# Patient Record
Sex: Female | Born: 1990 | Race: Black or African American | Hispanic: No | Marital: Single | Smoking: Never smoker
Health system: Southern US, Community
[De-identification: ages and names within clinical notes are randomized; demographics above are authoritative.]

## PROBLEM LIST (undated history)

## (undated) DIAGNOSIS — R87619 Unspecified abnormal cytological findings in specimens from cervix uteri: Secondary | ICD-10-CM

## (undated) HISTORY — PX: NO PAST SURGERIES: SHX2092

## (undated) HISTORY — DX: Unspecified abnormal cytological findings in specimens from cervix uteri: R87.619

---

## 2014-04-27 ENCOUNTER — Emergency Department: Payer: Self-pay | Admitting: Emergency Medicine

## 2016-02-28 HISTORY — PX: COLPOSCOPY: SHX161

## 2016-06-12 ENCOUNTER — Encounter (INDEPENDENT_AMBULATORY_CARE_PROVIDER_SITE_OTHER): Payer: Self-pay

## 2016-06-12 ENCOUNTER — Ambulatory Visit: Payer: Self-pay | Attending: Oncology

## 2016-06-12 DIAGNOSIS — R87622 Low grade squamous intraepithelial lesion on cytologic smear of vagina (LGSIL): Secondary | ICD-10-CM

## 2016-06-12 NOTE — Progress Notes (Signed)
26 year old patient referred to [facility] by [facility] Dept for abnormal pap with LSIL results, not tested for HPV.  Referred to Dr. Greggory Keen on 06/20/16 for consult, and possible biopsy.  Reviewed pap results with patient.  Literature on HPV given to patient.

## 2016-06-20 ENCOUNTER — Other Ambulatory Visit: Payer: Self-pay | Admitting: Obstetrics and Gynecology

## 2016-06-20 ENCOUNTER — Ambulatory Visit (INDEPENDENT_AMBULATORY_CARE_PROVIDER_SITE_OTHER): Payer: PRIVATE HEALTH INSURANCE | Admitting: Obstetrics and Gynecology

## 2016-06-20 ENCOUNTER — Encounter: Payer: Self-pay | Admitting: Obstetrics and Gynecology

## 2016-06-20 VITALS — BP 105/74 | HR 83 | Wt 321.9 lb

## 2016-06-20 DIAGNOSIS — Z8619 Personal history of other infectious and parasitic diseases: Secondary | ICD-10-CM | POA: Diagnosis not present

## 2016-06-20 DIAGNOSIS — A5901 Trichomonal vulvovaginitis: Secondary | ICD-10-CM | POA: Diagnosis not present

## 2016-06-20 DIAGNOSIS — R87612 Low grade squamous intraepithelial lesion on cytologic smear of cervix (LGSIL): Secondary | ICD-10-CM

## 2016-06-20 LAB — POCT URINE PREGNANCY: Preg Test, Ur: NEGATIVE

## 2016-06-20 MED ORDER — METRONIDAZOLE 500 MG PO TABS: 500.0000 mg | ORAL_TABLET | Freq: Two times a day (BID) | ORAL | 0 refills | Status: DC

## 2016-06-20 NOTE — Patient Instructions (Addendum)
1. Colposcopy with biopsies is performed today 2. Return in 2 weeks for follow-up on pathology reports 3. Strongly encourage condom use 4. Strongly encouraged avoidance of tobacco products 5. Take metronidazole 500 mg twice a day for 7 days   Colposcopy, Care After This sheet gives you information about how to care for yourself after your procedure. Your doctor may also give you more specific instructions. If you have problems or questions, contact your doctor. What can I expect after the procedure? If you did not have a tissue sample removed (did not have a biopsy), you may only have some spotting for a few days. You can go back to your normal activities. If you had a tissue sample removed, it is common to have:  Soreness and pain. This may last for a few days.  Light-headedness.  Mild bleeding from your vagina or dark-colored, grainy discharge from your vagina. This may last for a few days. You may need to wear a sanitary pad.  Spotting for at least 48 hours after the procedure. Follow these instructions at home:  Take over-the-counter and prescription medicines only as told by your doctor. Ask your doctor what medicines you can start taking again. This is very important if you take blood-thinning medicine.  Do not drive or use heavy machinery while taking prescription pain medicine.  For 3 days, or as long as your doctor tells you, avoid:  Douching.  Using tampons.  Having sex.  If you use birth control (contraception), keep using it.  Limit activity for the first day after the procedure. Ask your doctor what activities are safe for you.  It is up to you to get the results of your procedure. Ask your doctor when your results will be ready.  Keep all follow-up visits as told by your doctor. This is important. Contact a doctor if:  You get a skin rash. Get help right away if:  You are bleeding a lot from your vagina. It is a lot of bleeding if you are using more than  one pad an hour for 2 hours in a row.  You have clumps of blood (blood clots) coming from your vagina.  You have a fever.  You have chills  You have pain in your lower belly (pelvic area).  You have signs of infection, such as vaginal discharge that is:  Different than usual.  Yellow.  Bad-smelling.  You have very pain or cramps in your lower belly that do not get better with medicine.  You feel light-headed.  You feel dizzy.  You pass out (faint). Summary  If you did not have a tissue sample removed (did not have a biopsy), you may only have some spotting for a few days. You can go back to your normal activities.  If you had a tissue sample removed, it is common to have mild pain and spotting for 48 hours.  For 3 days, or as long as your doctor tells you, avoid douching, using tampons and having sex.  Get help right away if you have bleeding, very bad pain, or signs of infection. This information is not intended to replace advice given to you by your health care provider. Make sure you discuss any questions you have with your health care provider. Document Released: 08/02/2007 Document Revised: 11/03/2015 Document Reviewed: 11/03/2015 Elsevier Interactive Patient Education  2017 [employer].

## 2016-06-20 NOTE — Progress Notes (Addendum)
GYN ENCOUNTER NOTE  Subjective:       Michele Scott is a 26 y.o. G0P0000 female is here for gynecologic evaluation of the following issues:  1. BCCCPReferral 2. LGSIL Pap smear  Nonsmoker. History of 2 partners in the past 12 months History of chlamydia infection 8 months ago, treated Condom use, inconsistent First abnormal Pap smear-LGSIL, referred from [facility]   Gynecologic History Patient's last menstrual period was 04/20/2016 (approximate). Contraception: condoms Last Pap: LGSIL Last mammogram: N/A  Obstetric History OB History  Gravida Para Term Preterm AB Living  0 0 0 0 0 0  SAB TAB Ectopic Multiple Live Births  0 0 0 0 0        Past Medical History:  Diagnosis Date  . Abnormal Pap smear of cervix     Past Surgical History:  Procedure Laterality Date  . NO PAST SURGERIES      No current outpatient prescriptions on file prior to visit.   No current facility-administered medications on file prior to visit.     No Known Allergies  Social History   Social History  . Marital status: Single    Spouse name: N/A  . Number of children: N/A  . Years of education: N/A   Occupational History  . Not on file.   Social History Main Topics  . Smoking status: Never Smoker  . Smokeless tobacco: Never Used  . Alcohol use Yes     Comment: occas  . Drug use: No  . Sexual activity: Yes    Birth control/ protection: None   Other Topics Concern  . Not on file   Social History Narrative  . No narrative on file    Family History  Problem Relation Age of Onset  . Diabetes Mother   . Breast cancer Neg Hx   . Ovarian cancer Neg Hx   . Colon cancer Neg Hx   . Heart disease Neg Hx     The following portions of the patient's history were reviewed and updated as appropriate: allergies, current medications, past family history, past medical history, past social history, past surgical history and problem list.  Review of Systems Review of Systems  - Objective:   BP 105/74   Pulse 83   Wt (!) 321 lb 14.4 oz (146 kg)   LMP 04/20/2016 (Approximate)  CONSTITUTIONAL: Well-developed, well-nourished female in no acute distress.  HENT:  Normocephalic, atraumatic.  NECK: Not examined SKIN: Skin is warm and dry. No rash noted. Not diaphoretic. No erythema. No pallor. NEUROLGIC: Alert and oriented to person, place, and time. PSYCHIATRIC: Normal mood and affect. Normal behavior. Normal judgment and thought content. CARDIOVASCULAR:Not Examined RESPIRATORY: Not Examined BREASTS: Not Examined ABDOMEN: Soft, non distended; Non tender.  No Organomegaly. PELVIC:  External Genitalia: Normal  BUS: Normal  Vagina: Frothy white discharge  Cervix: Normal  Uterus: Not examined  Adnexa: Not examined  RV: Normal external exam  Bladder: Nontender MUSCULOSKELETAL: Normal range of motion. No tenderness.  No cyanosis, clubbing, or edema.  PROCEDURE: Colposcopy of cervix and upper adjacent vagina with biopsies Indications: LGSIL Pap smear Findings: SCJ is visualized; prominent rim of acetowhite epithelium 360 Biopsies: Cervix biopsy 5:00, cervix biopsy 7:00, cervix biopsy 11:00 Description: Verbal consent is obtained for colposcopy. The patient was placed in the dorsal lithotomy position. A Graves' speculum was placed into the vagina to facilitate visualization of the cervix. Cervix is painted with acetic acid solution using Fox swabs. Colposcopy is performed with normal lites and green filter.  The above-noted findings were identified. Cervical biopsies were taken. Monsel solution was applied for hemostasis. Procedure was well-tolerated. Blood loss is moderate requiring Monsel solution for hemostasis. Post procedure instructions sheet is given.  PROCEDURE: Wet prep Normal saline-moderate 1 blood cells; Trichomonas present; no clue cells KOH-negative  Assessment:   1. 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 Morbid obesity  (HCC)  3. History of chlamydia  4. Trichomonas vaginitis     Plan:   1. Colposcopy of cervix and upper adjacent vagina with biopsies 2. Cervical biopsies: 3. Return in 2 weeks for follow-up 4. Encourage condom use 5. Encourage avoidance of tobacco products 6. Metronidazole 500 mg twice a day for 7 days; recommend contact be treated before resuming intercourse  Herold Harms, MD  Note: This dictation was prepared with Dragon dictation along with smaller phrase technology. Any transcriptional errors that result from this process are unintentional.

## 2016-06-23 LAB — PATHOLOGY

## 2016-08-14 NOTE — Progress Notes (Addendum)
Per Dr Greggory KeeneFrancesco, benign cervical biopsy results.  Patient to return to [facility] 01/03/17 for 6 month follow-up colposcopy.  Copy to [facility].

## 2017-01-03 ENCOUNTER — Encounter: Payer: PRIVATE HEALTH INSURANCE | Admitting: Obstetrics and Gynecology

## 2017-03-06 ENCOUNTER — Encounter: Payer: PRIVATE HEALTH INSURANCE | Admitting: Obstetrics and Gynecology

## 2018-07-11 ENCOUNTER — Encounter: Payer: Self-pay | Admitting: Obstetrics and Gynecology

## 2018-07-11 ENCOUNTER — Other Ambulatory Visit (HOSPITAL_COMMUNITY)
Admission: RE | Admit: 2018-07-11 | Discharge: 2018-07-11 | Disposition: A | Discharge status: Home / Self Care | Payer: Self-pay | Source: Ambulatory Visit | Attending: Obstetrics and Gynecology | Admitting: Obstetrics and Gynecology

## 2018-07-11 ENCOUNTER — Ambulatory Visit (INDEPENDENT_AMBULATORY_CARE_PROVIDER_SITE_OTHER): Payer: BLUE CROSS/BLUE SHIELD | Admitting: Obstetrics and Gynecology

## 2018-07-11 ENCOUNTER — Other Ambulatory Visit: Payer: Self-pay

## 2018-07-11 VITALS — BP 110/80 | HR 101 | Ht 68.0 in | Wt 312.0 lb

## 2018-07-11 DIAGNOSIS — Z124 Encounter for screening for malignant neoplasm of cervix: Secondary | ICD-10-CM | POA: Insufficient documentation

## 2018-07-11 DIAGNOSIS — Z113 Encounter for screening for infections with a predominantly sexual mode of transmission: Secondary | ICD-10-CM | POA: Insufficient documentation

## 2018-07-11 DIAGNOSIS — N939 Abnormal uterine and vaginal bleeding, unspecified: Secondary | ICD-10-CM

## 2018-07-11 NOTE — Progress Notes (Signed)
Gynecology H&P  PCP: Michele Scott, No Pcp Per  Chief Complaint:  Chief Complaint  Michele Scott presents with   Gynecologic Exam    STD testing, Hx of abnormal pap    History of Present Illness: Michele Scott is a 28 y.o. G0P0000 presenting for evaluation of infertility. Michele Scott has not been actively contracepting for several years but has not conceived.   Michele Scott is a 28 y.o. G0P0000 presents for STD testing.  The Michele Scott has not noted intermenstrual spotting,  has not experienced postcoital bleeding, and does not report increased vaginal discharge.  There is a history of prior sexually transmitted infection(s).     The Michele Scott is sexually active. She currently uses None for contraception.  The Michele Scott has not been previously transfused or tattooed.     Ovulatory Evaluation LMP: Michele Scott's last menstrual period was 06/18/2018 (exact date). Interval irregular Duration of flow: variable but prolonged 2-3 weeks Heavy Menses: no Clots: no Intermenstrual Bleeding: no Dysmenorrhea: no  Molimina sometime Ovulation Predictor Kits Positive  no  PCOS  Wt Change: yes up and down 25lbs in past year Hirsutism: yes Acne: yes  Hyperprolactinemia Galactorrhea: no Headaches: no Vision Changes:  no  Thyroid Temperature Intolerance: yes Constipation or Diarrhea: yes Hair Thinning:  yes Palpitation:  no  Prior treatments Meds: none Other Therapies: Not applicable  Premature Ovarian Failure Family history of autism, mental retardation, or fragile X: no Family history of premature ovarian failure or early menopause: no Prior radiation or chemotherapy exposoure:  no  Tubal Factor Previous abdominal or pelvic surgery: no Pelvic Pain:  no Endometriosis: no STD: yes remote history of chlamydia PID: no Laparoscopy: no Prior HSG: no  Review of Systems: 10 point review of systems negative unless otherwise noted in HPI  Past Medical History:  Past Medical History:  Diagnosis Date    Abnormal Pap smear of cervix     Past Surgical History:  Past Surgical History:  Procedure Laterality Date   COLPOSCOPY  2018   [facility]   NO PAST SURGERIES      Obstetric History:  Never pregnant  Family History:  Family History  Problem Relation Age of Onset   Diabetes Mother    Breast cancer Neg Hx    Ovarian cancer Neg Hx    Colon cancer Neg Hx    Heart disease Neg Hx    Thyroid Problems: no Heart Condition or High Blood Pressure: no Blood Clot or Stroke: no Diabetes: no Cancer: no Birth Defects/Inherited diseases:no Infectious diseases (mumps, TB, Rubella):no Other Medical Problems: no  Social History:  Social History   Socioeconomic History   Marital status: Single    Spouse name: Not on file   Number of children: Not on file   Years of education: Not on file   Highest education level: Not on file  Occupational History   Not on file  Social Needs   Financial resource strain: Not on file   Food insecurity:    Worry: Not on file    Inability: Not on file   Transportation needs:    Medical: Not on file    Non-medical: Not on file  Tobacco Use   Smoking status: Never Smoker   Smokeless tobacco: Never Used  Substance and Sexual Activity   Alcohol use: Yes    Comment: occas   Drug use: Yes    Types: Marijuana   Sexual activity: Yes    Birth control/protection: None  Lifestyle   Physical activity:    Days per  week: Not on file    Minutes per session: Not on file   Stress: Not on file  Relationships   Social connections:    Talks on phone: Not on file    Gets together: Not on file    Attends religious service: Not on file    Active member of club or organization: Not on file    Attends meetings of clubs or organizations: Not on file    Relationship status: Not on file   Intimate partner violence:    Fear of current or ex partner: Not on file    Emotionally abused: Not on file    Physically abused: Not on file     Forced sexual activity: Not on file  Other Topics Concern   Not on file  Social History Narrative   Not on file    Allergies:  No Known Allergies  Medications: Prior to Admission medications   Not on File    Physical Exam Vitals: Blood pressure 110/80, pulse (!) 101, height  (1.727 m), weight (!) 312 lb (141.5 kg), last menstrual period 06/18/2018.  General: NAD, well nourished, appears stated age HEENT: normocephalic, anicteric Pulmonary: No increased work of breathing Genitourinary:  External: Normal external female genitalia.  Normal urethral meatus, normal Bartholin's and Skene's glands.    Vagina: Normal vaginal mucosa, no evidence of prolapse.    Cervix: Grossly normal in appearance, no bleeding  Uterus: Non-enlarged, mobile, normal contour.  No CMT  Adnexa: ovaries non-enlarged, no adnexal masses  Rectal: deferred  Lymphatic: no evidence of inguinal lymphadenopathy Extremities: no edema, erythema, or tenderness Neurologic: Grossly intact Psychiatric: mood appropriate, affect full  @  Assessment: 28 y.o. G0P0000 presenting for initial infertility evaluatoin Plan: 1) We discussed the underlying etiologies which may be implicated in a couple experiencing difficulty conceiving.  The average couple will conceive within the span of 1 year with unprotected coitus, with a monthly fecundity rate of 20% or 1 in 5.  Even without further work up or intervention the Michele Scott and her partner may be successful in conceiving unassisted, although if an underlying etiology can be identified and addressed fecundity rate may improve.  The work up entails examining for ovulatory function, tubal patency, and ruling out female factor infertility.  These may be looked at concurrently or sequentially.  The downside of sequential work up is that this method may miss issues if more than one compartment is contributing.  She is aware that tubal factor or moderate to severe female factor  infertility will require further consultation with a reproductive endocrinologist.  In the case of anovulation, use of Clomid (clomiphen citrate) or Femara (letrazole) were discussed with the understanding the the later is an off-label, but well supported use.  With either of these drugs the risk of multiples increases from the standard population rate of 2% to approximately 10%, with higher order multiples possible but unlikely.  Both drugs may require some time to titrate to the appropriate dosage to ensure consistent ovulation.  Cycles will be limited to 6 cycles on each drug secondary to decreasing rates of conception after 6 cycles.  In addition should Michele Scott be started on ovulation induction with Clomid she was advised to discontinue the drug for any vision changes as this is a rare but potentially permanent side-effect if medication is continued.  We discussed timing of intercourse as well as the use of ovulation predictor kits identify the Michele Scott's fertile window each month.     2) STI  screening obtained as well as pap given history of prior LGSIL pap  3) Return in about 1 year (around 07/11/2019) for annual.    Vena AustriaAndreas Aariz Maish, MD, Merlinda FrederickFACOG [facility], [facility] 07/11/2018, 7:31 PM     Menses more remote history of chlamydia always been kind of irregular weight up, some problems hair growth acne  Discussed letrozole  Pap and STI testing

## 2018-07-12 ENCOUNTER — Telehealth: Payer: Self-pay | Admitting: Family

## 2018-07-12 DIAGNOSIS — R059 Cough, unspecified: Secondary | ICD-10-CM

## 2018-07-12 DIAGNOSIS — R05 Cough: Secondary | ICD-10-CM

## 2018-07-12 MED ORDER — BENZONATATE 100 MG PO CAPS: 100.0000 mg | ORAL_CAPSULE | Freq: Three times a day (TID) | ORAL | 0 refills | Status: DC | PRN

## 2018-07-12 MED ORDER — ALBUTEROL SULFATE HFA 108 (90 BASE) MCG/ACT IN AERS: 2.0000 | INHALATION_SPRAY | Freq: Four times a day (QID) | RESPIRATORY_TRACT | 0 refills | Status: DC | PRN

## 2018-07-12 NOTE — Progress Notes (Signed)
Greater than 5 minutes, yet less than 10 minutes of time have been spent researching, coordinating, and implementing care for this patient today.  Thank you for the details you included in the comment boxes. Those details are very helpful in determining the best course of treatment for you and help us to provide the best care.  E-Visit for Corona Virus Screening  Based on your current symptoms, it seems unlikely that your symptoms are related to the Coronavirus.   You have been enrolled in MyChart Home Monitoring for COVID-19. Daily you will receive a questionnaire within the MyChart website. Our COVID-19 response team will be monitoring your responses daily.   Coronavirus disease 2019 (COVID-19) is a respiratory illness that can spread from person to person. The virus that causes COVID-19 is a new virus that was first identified in the country of [place] but is now found in multiple other countries and has spread to the [place].  Symptoms associated with the virus are mild to severe fever, cough, and shortness of breath. There is currently no vaccine to protect against COVID-19, and there is no specific antiviral treatment for the virus.   To be considered HIGH RISK for Coronavirus (COVID-19), you have to meet the following criteria:  . Traveled to [place], [place], [place], [place] or [place]; or in the [place] to [place], [place], [place], or [place]; and have fever, cough, and shortness of breath within the last 2 weeks of travel OR  . Been in close contact with a person diagnosed with COVID-19 within the last 2 weeks and have fever, cough, and shortness of breath  . IF YOU DO NOT MEET THESE CRITERIA, YOU ARE CONSIDERED LOW RISK FOR COVID-19.   It is vitally important that if you feel that you have an infection such as this virus or any other virus that you stay home and away from places where you may spread it to others.  You should self-quarantine for 14 days if you have  symptoms that could potentially be coronavirus and avoid contact with people age 65 and older.   You can use medication such as A prescription cough medication called Tessalon Perles 100 mg. You may take 1-2 capsules every 8 hours as needed for cough and A prescription inhaler called Albuterol MDI 90 mcg /actuation 2 puffs every 4 hours as needed for shortness of breath, wheezing, cough  You may also take acetaminophen (Tylenol) as needed for fever.   Reduce your risk of any infection by using the same precautions used for avoiding the common cold or flu:  . Wash your hands often with soap and warm water for at least 20 seconds.  If soap and water are not readily available, use an alcohol-based hand sanitizer with at least 60% alcohol.  . If coughing or sneezing, cover your mouth and nose by coughing or sneezing into the elbow areas of your shirt or coat, into a tissue or into your sleeve (not your hands). . Avoid shaking hands with others and consider head nods or verbal greetings only. . Avoid touching your eyes, nose, or mouth with unwashed hands.  . Avoid close contact with people who are sick. . Avoid places or events with large numbers of people in one location, like concerts or sporting events. . Carefully consider travel plans you have or are making. . If you are planning any travel outside or inside the [place], visit the CDC's Travelers' Health webpage for the latest health notices. .   If you have some symptoms but not all symptoms, continue to monitor at home and seek medical attention if your symptoms worsen. . If you are having a medical emergency, call 911.  HOME CARE . Only take medications as instructed by your medical team. . Drink plenty of fluids and get plenty of rest. . A steam or ultrasonic humidifier can help if you have congestion.   GET HELP RIGHT AWAY IF: . You develop worsening fever. . You become short of breath . You cough up blood. . Your symptoms become more  severe MAKE SURE YOU   Understand these instructions.  Will watch your condition.  Will get help right away if you are not doing well or get worse.  Your e-visit answers were reviewed by a board certified advanced clinical practitioner to complete your personal care plan.  Depending on the condition, your plan could have included both over the counter or prescription medications.  If there is a problem please reply once you have received a response from your provider. Your safety is important to us.  If you have drug allergies check your prescription carefully.    You can use MyChart to ask questions about today's visit, request a non-urgent call back, or ask for a work or school excuse for 24 hours related to this e-Visit. If it has been greater than 24 hours you will need to follow up with your provider, or enter a new e-Visit to address those concerns. You will get an e-mail in the next two days asking about your experience.  I hope that your e-visit has been valuable and will speed your recovery. Thank you for using e-visits.    

## 2018-07-13 LAB — TSH+PRL+FSH+TESTT+LH+DHEA S...
17-Hydroxyprogesterone: 136 ng/dL
Androstenedione: 66 ng/dL (ref 41–262)
DHEA-SO4: 258.6 ug/dL (ref 84.8–378.0)
FSH: 2.7 m[IU]/mL
LH: 9 m[IU]/mL
Prolactin: 16.5 ng/mL (ref 4.8–23.3)
TSH: 1.81 u[IU]/mL (ref 0.450–4.500)
Testosterone, Free: 1.5 pg/mL (ref 0.0–4.2)
Testosterone: 22 ng/dL (ref 8–48)

## 2018-07-13 LAB — HEP, RPR, HIV PANEL
HIV Screen 4th Generation wRfx: NONREACTIVE
Hepatitis B Surface Ag: NEGATIVE
RPR Ser Ql: NONREACTIVE

## 2018-07-13 LAB — HEPATITIS C ANTIBODY: Hep C Virus Ab: 4.4 s/co ratio — ABNORMAL HIGH (ref 0.0–0.9)

## 2018-07-16 ENCOUNTER — Other Ambulatory Visit: Payer: Self-pay | Admitting: Obstetrics and Gynecology

## 2018-07-16 DIAGNOSIS — N939 Abnormal uterine and vaginal bleeding, unspecified: Secondary | ICD-10-CM

## 2018-07-16 DIAGNOSIS — E282 Polycystic ovarian syndrome: Secondary | ICD-10-CM

## 2018-07-16 DIAGNOSIS — R768 Other specified abnormal immunological findings in serum: Secondary | ICD-10-CM

## 2018-07-17 LAB — CYTOLOGY - PAP
Chlamydia: POSITIVE — AB
Diagnosis: UNDETERMINED — AB
HPV: NOT DETECTED
Neisseria Gonorrhea: NEGATIVE

## 2018-07-19 ENCOUNTER — Telehealth: Payer: Self-pay | Admitting: Obstetrics and Gynecology

## 2018-07-19 ENCOUNTER — Other Ambulatory Visit: Payer: BLUE CROSS/BLUE SHIELD

## 2018-07-19 NOTE — Telephone Encounter (Signed)
Patient is schedule for Colpo with ultrasound

## 2018-07-19 NOTE — Telephone Encounter (Signed)
-----   Message from Vena Austria, MD sent at 07/19/2018 12:48 PM EDT ----- Regarding: colposcopy Needs colposcopy in the next 1-2 weeks.   Vena Austria, MD, [facility], [facility] 07/19/2018, 12:48 PM

## 2018-07-23 ENCOUNTER — Ambulatory Visit: Payer: BLUE CROSS/BLUE SHIELD | Admitting: Obstetrics and Gynecology

## 2018-07-23 ENCOUNTER — Other Ambulatory Visit: Payer: BLUE CROSS/BLUE SHIELD

## 2018-07-25 ENCOUNTER — Other Ambulatory Visit: Payer: BLUE CROSS/BLUE SHIELD

## 2018-07-29 ENCOUNTER — Ambulatory Visit: Payer: BLUE CROSS/BLUE SHIELD

## 2018-07-29 ENCOUNTER — Ambulatory Visit: Payer: BLUE CROSS/BLUE SHIELD | Admitting: Obstetrics and Gynecology

## 2018-08-13 NOTE — Telephone Encounter (Signed)
Called and left voice mail for patient to call back to be schedule °

## 2018-08-13 NOTE — Telephone Encounter (Signed)
Need lab visit follow up at the least she has rescheduled several appointments

## 2018-09-03 ENCOUNTER — Encounter: Payer: Self-pay | Admitting: Advanced Practice Midwife

## 2018-09-03 ENCOUNTER — Other Ambulatory Visit: Payer: Self-pay

## 2018-09-03 ENCOUNTER — Ambulatory Visit (INDEPENDENT_AMBULATORY_CARE_PROVIDER_SITE_OTHER): Payer: BC Managed Care – PPO | Admitting: Advanced Practice Midwife

## 2018-09-03 VITALS — BP 126/78 | Wt 317.0 lb

## 2018-09-03 DIAGNOSIS — Z3A01 Less than 8 weeks gestation of pregnancy: Secondary | ICD-10-CM

## 2018-09-03 DIAGNOSIS — A749 Chlamydial infection, unspecified: Secondary | ICD-10-CM

## 2018-09-03 DIAGNOSIS — O0990 Supervision of high risk pregnancy, unspecified, unspecified trimester: Secondary | ICD-10-CM

## 2018-09-03 DIAGNOSIS — Z3201 Encounter for pregnancy test, result positive: Secondary | ICD-10-CM

## 2018-09-03 DIAGNOSIS — O99210 Obesity complicating pregnancy, unspecified trimester: Secondary | ICD-10-CM | POA: Insufficient documentation

## 2018-09-03 DIAGNOSIS — O0991 Supervision of high risk pregnancy, unspecified, first trimester: Secondary | ICD-10-CM

## 2018-09-03 DIAGNOSIS — Z6841 Body Mass Index (BMI) 40.0 and over, adult: Secondary | ICD-10-CM

## 2018-09-03 DIAGNOSIS — O99211 Obesity complicating pregnancy, first trimester: Secondary | ICD-10-CM

## 2018-09-03 DIAGNOSIS — O98811 Other maternal infectious and parasitic diseases complicating pregnancy, first trimester: Secondary | ICD-10-CM

## 2018-09-03 DIAGNOSIS — N912 Amenorrhea, unspecified: Secondary | ICD-10-CM

## 2018-09-03 LAB — POCT URINE PREGNANCY: Preg Test, Ur: POSITIVE — AB

## 2018-09-03 MED ORDER — AZITHROMYCIN 500 MG PO TABS: 1000.0000 mg | ORAL_TABLET | Freq: Once | ORAL | 0 refills | Status: AC

## 2018-09-03 NOTE — Progress Notes (Signed)
New Obstetric Patient H&P    Chief Complaint: "Desires prenatal care"   History of Present Illness: Patient is a 28 y.o. G1P0000 Not Hispanic or Latino female, presents with amenorrhea and positive home pregnancy test. Patient's last menstrual period was 07/18/2018 (exact date). and based on her  LMP, her EDD is Estimated Date of Delivery: 04/24/19 and her EGA is [redacted]w[redacted]d. Cycles are 5. days, regular, and occur approximately every : 30 days. Her last pap smear was 2 months ago and was ASCUS with NEGATIVE high risk HPV.    She had a urine pregnancy test which was positive 1 week(s)  ago. Her last menstrual period was normal and lasted for  4 or 5 day(s). Since her LMP she claims she has experienced breast tenderness, nausea, vomiting rarely. She denies vaginal bleeding. Her past medical history is contributory for obesity with BMI greater than 45 at NOB. This is her first pregnancy.  Since her LMP, she admits to the use of tobacco products  no She claims she has gained   5 pounds since the start of her pregnancy.  There are cats in the home in the home  no  She admits close contact with children on a regular basis  no  She has had chicken pox in the past no She has had Tuberculosis exposures, symptoms, or previously tested positive for TB   no Current or past history of domestic violence. no  Genetic Screening/Teratology Counseling: (Includes patient, baby's father, or anyone in either family with:)   80. Patient's age >/= 81 at [facility]  no 2. Thalassemia ([place], [place], [place], or Asian background): MCV<80  no 3. Neural tube defect (meningomyelocele, spina bifida, anencephaly)  no 4. Congenital heart defect  no  5. Down syndrome  no 6. Tay-Sachs (Jewish, [place])  no 7. Canavan's Disease  no 8. Sickle cell disease or trait (African)  no  9. Hemophilia or other blood disorders  no  10. Muscular dystrophy  no  11. Cystic fibrosis  no  12. Huntington's Chorea  no  13. Mental  retardation/autism  no 14. Other inherited genetic or chromosomal disorder  no 15. Maternal metabolic disorder (DM, PKU, etc)  no 16. Patient or FOB with a child with a birth defect not listed above no  16a. Patient or FOB with a birth defect themselves no 17. Recurrent pregnancy loss, or stillbirth  no  18. Any medications since LMP other than prenatal vitamins (include vitamins, supplements, OTC meds, drugs, alcohol)  no 19. Any other genetic/environmental exposure to discuss  no  Infection History:   1. Lives with someone with TB or TB exposed  no  2. Patient or partner has history of genital herpes  no 3. Rash or viral illness since LMP  no 4. History of STI (GC, CT, HPV, syphilis, HIV)  Yes- Chlamydia diagnosed in May of 2020, treated on 09/03/2018 5. History of recent travel :  no  Other pertinent information:  She is uncertain if she is going to follow through with the pregnancy. She plans to have dating scan in 1 week. She is uncertain if her [employer] has maternity coverage and will look into that and apply for Pregnancy Medicaid as needed.    Review of Systems:10 point review of systems negative unless otherwise noted in HPI  Past Medical History:  Past Medical History:  Diagnosis Date  . Abnormal Pap smear of cervix     Past Surgical History:  Past Surgical History:  Procedure  Laterality Date  . COLPOSCOPY  2018   [facility]  . NO PAST SURGERIES      Gynecologic History: Patient's last menstrual period was 07/18/2018 (exact date).  Obstetric History: G1P0000  Family History:  Family History  Problem Relation Age of Onset  . Diabetes Mother   . Breast cancer Neg Hx   . Ovarian cancer Neg Hx   . Colon cancer Neg Hx   . Heart disease Neg Hx     Social History:  Social History   Socioeconomic History  . Marital status: Single    Spouse name: Not on file  . Number of children: Not on file  . Years of education: Not on file  . Highest education level: Not on  file  Occupational History  . Not on file  Social Needs  . Financial resource strain: Not on file  . Food insecurity    Worry: Not on file    Inability: Not on file  . Transportation needs    Medical: Not on file    Non-medical: Not on file  Tobacco Use  . Smoking status: Never Smoker  . Smokeless tobacco: Never Used  Substance and Sexual Activity  . Alcohol use: Yes    Comment: occas  . Drug use: Yes    Types: Marijuana  . Sexual activity: Yes    Birth control/protection: None  Lifestyle  . Physical activity    Days per week: Not on file    Minutes per session: Not on file  . Stress: Not on file  Relationships  . Social Musicianconnections    Talks on phone: Not on file    Gets together: Not on file    Attends religious service: Not on file    Active member of club or organization: Not on file    Attends meetings of clubs or organizations: Not on file    Relationship status: Not on file  . Intimate partner violence    Fear of current or ex partner: Not on file    Emotionally abused: Not on file    Physically abused: Not on file    Forced sexual activity: Not on file  Other Topics Concern  . Not on file  Social History Narrative  . Not on file    Allergies:  No Known Allergies  Medications: Prior to Admission medications   Medication Sig Start Date End Date Taking? Authorizing Provider  azithromycin (ZITHROMAX) 500 MG tablet Take 2 tablets (1,000 mg total) by mouth once for 1 dose. 09/03/18 09/03/18  Tresea MallGledhill, Leeasia Secrist, CNM    Physical Exam Vitals: Blood pressure 126/78, weight (!) 317 lb (143.8 kg), last menstrual period 07/18/2018.  General: NAD HEENT: normocephalic, anicteric Thyroid: no enlargement, no palpable nodules Pulmonary: No increased work of breathing, CTAB Cardiovascular: RRR, distal pulses 2+ Abdomen: NABS, soft, non-tender, non-distended.  Umbilicus without lesions.  No hepatomegaly, splenomegaly or masses palpable. No evidence of hernia  Genitourinary:  deferred- recent PAP smear Extremities: no edema, erythema, or tenderness Neurologic: Grossly intact Psychiatric: mood appropriate, affect full   Assessment: 28 y.o. G1P0000 at [id number] presenting to initiate prenatal care  Plan: 1) Avoid alcoholic beverages. 2) Patient encouraged not to smoke.  3) Discontinue the use of all non-medicinal drugs and chemicals.  4) Take prenatal vitamins daily.  5) Nutrition, food safety (fish, cheese advisories, and high nitrite foods) and exercise discussed. 6) Hospital and practice style discussed with cross coverage system.  7) Genetic Screening, such as with 1st Trimester Screening, cell  free fetal DNA, AFP testing, and Ultrasound, as well as with amniocentesis and CVS as appropriate, is discussed with patient. At the conclusion of today's visit patient requested cell free DNA if insurance covers- otherwise wants 1st trimester genetic testing 8) Patient is asked about travel to areas at risk for the [place] virus, and counseled to avoid travel and exposure to mosquitoes or sexual partners who may have themselves been exposed to the virus. Testing is discussed, and will be ordered as appropriate.  9) Urine culture today 10) Return in 1 week for dating scan and rob 911) Will need to schedule Colposcopy 12) Will need all NOB labs including sickle cell and early 1 hour gtt, and genetic screening 13) TOC Chlamydia in August   Tresea MallJane Jadee Golebiewski, CNM [facility] 09/03/2018, 12:17 PM

## 2018-09-03 NOTE — Patient Instructions (Signed)
Perinatal Depression When a woman feels excessive sadness, anger, or anxiety during pregnancy or during the first 12 months after she gives birth, she has a condition called perinatal depression. Depression can interfere with work, school, relationships, and other everyday activities. If it is not managed properly, it can also cause problems in the mother and her baby. Sometimes, perinatal depression is left untreated because symptoms are thought to be normal mood swings during and right after pregnancy. If you have symptoms of depression, it is important to talk with your health care provider. What are the causes? The exact cause of this condition is not known. Hormonal changes during and after pregnancy may play a role in causing perinatal depression. What increases the risk? You are more likely to develop this condition if:  You have a personal or family history of depression, anxiety, or mood disorders.  You experience a stressful life event during pregnancy, such as the death of a loved one.  You have a lot of regular life stress.  You do not have support from family members or loved ones, or you are in an abusive relationship. What are the signs or symptoms? Symptoms of this condition include:  Feeling sad or hopeless.  Feelings of guilt.  Feeling irritable or overwhelmed.  Changes in your appetite.  Lack of energy or motivation.  Sleep problems.  Difficulty concentrating or completing tasks.  Loss of interest in hobbies or relationships.  Headaches or stomach problems that do not go away. How is this diagnosed? This condition is diagnosed based on a physical exam and mental evaluation. In some cases, your health care provider may use a depression screening tool. These tools include a list of questions that can help a health care provider diagnose depression. Your health care provider may refer you to a mental health expert who specializes in depression. How is this  treated? This condition may be treated with:  Medicines. Your health care provider will only give you medicines that have been proven safe for pregnancy and breastfeeding.  Talk therapy with a mental health professional to help change your patterns of thinking (cognitive behavioral therapy).  Support groups.  Brain stimulation or light therapies.  Stress reduction therapies, such as mindfulness. Follow these instructions at home: Lifestyle  Do not use any products that contain nicotine or tobacco, such as cigarettes and e-cigarettes. If you need help quitting, ask your health care provider.  Do not use alcohol when you are pregnant. After your baby is born, limit alcohol intake to no more than 1 drink a day. One drink equals 12 oz of beer, 5 oz of wine, or 1 oz of hard liquor.  Consider joining a support group for new mothers. Ask your health care provider for recommendations.  Take good care of yourself. Make sure you: ? Get plenty of sleep. If you are having trouble sleeping, talk with your health care provider. ? Eat a healthy diet. This includes plenty of fruits and vegetables, whole grains, and lean proteins. ? Exercise regularly, as told by your health care provider. Ask your health care provider what exercises are safe for you. General instructions  Take over-the-counter and prescription medicines only as told by your health care provider.  Talk with your partner or family members about your feelings during pregnancy. Share any concerns or anxieties that you may have.  Ask for help with tasks or chores when you need it. Ask friends and family members to provide meals, watch your children, or help with   cleaning.  Keep all follow-up visits as told by your health care provider. This is important. Contact a health care provider if:  You (or people close to you) notice that you have any symptoms of depression.  You have depression and your symptoms get worse.  You  experience side effects from medicines, such as nausea or sleep problems. Get help right away if:  You feel like hurting yourself, your baby, or someone else. If you ever feel like you may hurt yourself or others, or have thoughts about taking your own life, get help right away. You can go to your nearest emergency department or call:  Your local emergency services (911 in the [place]  A suicide crisis helpline, such as the National Suicide Prevention Lifeline at 1-800-273-8255. This is open 24 hours a day. Summary  Perinatal depression is when a woman feels excessive sadness, anger, or anxiety during pregnancy or during the first 12 months after she gives birth.  If perinatal depression is not treated, it can lead to health problems for the mother and her baby.  This condition is treated with medicines, talk therapy, stress reduction therapies, or a combination of two or more treatments.  Talk with your partner or family members about your feelings. Do not be afraid to ask for help. This information is not intended to replace advice given to you by your health care provider. Make sure you discuss any questions you have with your health care provider. Document Released: 04/12/2016 Document Revised: 02/16/2017 Document Reviewed: 04/12/2016 Elsevier Patient Education  2020 [employer] Eating Plan for Pregnant Women While you are pregnant, your body requires additional nutrition to help support your growing baby. You also have a higher need for some vitamins and minerals, such as folic acid, calcium, iron, and vitamin D. Eating a healthy, well-balanced diet is very important for your health and your baby's health. Your need for extra calories varies for the three 3-month segments of your pregnancy (trimesters). For most women, it is recommended to consume:  150 extra calories a day during the first trimester.  300 extra calories a day during the second trimester.  300 extra calories a day  during the third trimester. What are tips for following this plan?   Do not try to lose weight or go on a diet during pregnancy.  Limit your overall intake of foods that have "empty calories." These are foods that have little nutritional value, such as sweets, desserts, candies, and sugar-sweetened beverages.  Eat a variety of foods (especially fruits and vegetables) to get a full range of vitamins and minerals.  Take a prenatal vitamin to help meet your additional vitamin and mineral needs during pregnancy, specifically for folic acid, iron, calcium, and vitamin D.  Remember to stay active. Ask your health care provider what types of exercise and activities are safe for you.  Practice good food safety and cleanliness. Wash your hands before you eat and after you prepare raw meat. Wash all fruits and vegetables well before peeling or eating. Taking these actions can help to prevent food-borne illnesses that can be very dangerous to your baby, such as listeriosis. Ask your health care provider for more information about listeriosis. What does 150 extra calories look like? Healthy options that provide 150 extra calories each day could be any of the following:  6-8 oz (170-230 g) of plain low-fat yogurt with  cup of berries.  1 apple with 2 teaspoons (11 g) of peanut butter.  Cut-up vegetables with    cup (60 g) of hummus.  8 oz (230 mL) or 1 cup of low-fat chocolate milk.  1 stick of string cheese with 1 medium orange.  1 peanut butter and jelly sandwich that is made with one slice of whole-wheat bread and 1 tsp (5 g) of peanut butter. For 300 extra calories, you could eat two of those healthy options each day. What is a healthy amount of weight to gain? The right amount of weight gain for you is based on your BMI before you became pregnant. If your BMI:  Was less than 18 (underweight), you should gain 28-40 lb (13-18 kg).  Was 18-24.9 (normal), you should gain 25-35 lb (11-16  kg).  Was 25-29.9 (overweight), you should gain 15-25 lb (7-11 kg).  Was 30 or greater (obese), you should gain 11-20 lb (5-9 kg). What if I am having twins or multiples? Generally, if you are carrying twins or multiples:  You may need to eat 300-600 extra calories a day.  The recommended range for total weight gain is 25-54 lb (11-25 kg), depending on your BMI before pregnancy.  Talk with your health care provider to find out about nutritional needs, weight gain, and exercise that is right for you. What foods can I eat?  Grains All grains. Choose whole grains, such as whole-wheat bread, oatmeal, or brown rice. Vegetables All vegetables. Eat a variety of colors and types of vegetables. Remember to wash your vegetables well before peeling or eating. Fruits All fruits. Eat a variety of colors and types of fruit. Remember to wash your fruits well before peeling or eating. Meats and other protein foods Lean meats, including chicken, turkey, fish, and lean cuts of beef, veal, or pork. If you eat fish or seafood, choose options that are higher in omega-3 fatty acids and lower in mercury, such as salmon, herring, mussels, trout, sardines, pollock, shrimp, crab, and lobster. Tofu. Tempeh. Beans. Eggs. Peanut butter and other nut butters. Make sure that all meats, poultry, and eggs are cooked to food-safe temperatures or "well-done." Two or more servings of fish are recommended each week in order to get the most benefits from omega-3 fatty acids that are found in seafood. Choose fish that are lower in mercury. You can find more information online:  www.fda.gov Dairy Pasteurized milk and milk alternatives (such as almond milk). Pasteurized yogurt and pasteurized cheese. Cottage cheese. Sour cream. Beverages Water. Juices that contain 100% fruit juice or vegetable juice. Caffeine-free teas and decaffeinated coffee. Drinks that contain caffeine are okay to drink, but it is better to avoid caffeine.  Keep your total caffeine intake to less than 200 mg each day (which is 12 oz or 355 mL of coffee, tea, or soda) or the limit as told by your health care provider. Fats and oils Fats and oils are okay to include in moderation. Sweets and desserts Sweets and desserts are okay to include in moderation. Seasoning and other foods All pasteurized condiments. The items listed above may not be a complete list of recommended foods and beverages. Contact your dietitian for more options. The items listed above may not be a complete list of foods and beverages [you/your child] can eat. Contact a dietitian for more information. What foods are not recommended? Vegetables Raw (unpasteurized) vegetable juices. Fruits Unpasteurized fruit juices. Meats and other protein foods Lunch meats, bologna, hot dogs, or other deli meats. (If you must eat those meats, reheat them until they are steaming hot.) Refrigerated pat, meat spreads from a meat   counter, smoked seafood that is found in the refrigerated section of a store. Raw or undercooked meats, poultry, and eggs. Raw fish, such as sushi or sashimi. Fish that have high mercury content, such as tilefish, shark, swordfish, and king mackerel. To learn more about mercury in fish, talk with your health care provider or look for online resources, such as:  www.fda.gov Dairy Raw (unpasteurized) milk and any foods that have raw milk in them. Soft cheeses, such as feta, queso blanco, queso fresco, Brie, Camembert cheeses, blue-veined cheeses, and Panela cheese (unless it is made with pasteurized milk, which must be stated on the label). Beverages Alcohol. Sugar-sweetened beverages, such as sodas, teas, or energy drinks. Seasoning and other foods Homemade fermented foods and drinks, such as pickles, sauerkraut, or kombucha drinks. (Store-bought pasteurized versions of these are okay.) Salads that are made in a store or deli, such as ham salad, chicken salad, egg salad,  tuna salad, and seafood salad. The items listed above may not be a complete list of foods and beverages to avoid. Contact your dietitian for more information. The items listed above may not be a complete list of foods and beverages [you/your child] should avoid. Contact a dietitian for more information. Where to find more information To calculate the number of calories you need based on your height, weight, and activity level, you can use an online calculator such as:  www.choosemyplate.gov/MyPlatePlan To calculate how much weight you should gain during pregnancy, you can use an online pregnancy weight gain calculator such as:  www.choosemyplate.gov/pregnancy-weight-gain-calculator Summary  While you are pregnant, your body requires additional nutrition to help support your growing baby.  Eat a variety of foods, especially fruits and vegetables to get a full range of vitamins and minerals.  Practice good food safety and cleanliness. Wash your hands before you eat and after you prepare raw meat. Wash all fruits and vegetables well before peeling or eating. Taking these actions can help to prevent food-borne illnesses, such as listeriosis, that can be very dangerous to your baby.  Do not eat raw meat or fish. Do not eat fish that have high mercury content, such as tilefish, shark, swordfish, and king mackerel. Do not eat unpasteurized (raw) dairy.  Take a prenatal vitamin to help meet your additional vitamin and mineral needs during pregnancy, specifically for folic acid, iron, calcium, and vitamin D. This information is not intended to replace advice given to you by your health care provider. Make sure you discuss any questions you have with your health care provider. Document Released: 11/28/2013 Document Revised: 06/06/2018 Document Reviewed: 11/10/2016 Elsevier Patient Education  2020 [employer] Exercise During Pregnancy Exercise is an important part of being healthy for people of all  ages. Exercise improves the function of your heart and lungs and helps you maintain strength, flexibility, and a healthy body weight. Exercise also boosts energy levels and elevates mood. Most women should exercise regularly during pregnancy. In rare cases, women with certain medical conditions or complications may be asked to limit or avoid exercise during pregnancy. How does this affect me? Along with maintaining general strength and flexibility, exercising during pregnancy can help:  Keep strength in muscles that are used during labor and childbirth.  Decrease low back pain.  Reduce symptoms of depression.  Control weight gain during pregnancy.  Reduce the risk of needing insulin if you develop diabetes during pregnancy.  Decrease the risk of cesarean delivery.  Speed up your recovery after giving birth. How does this affect my   baby? Exercise can help you have a healthy pregnancy. Exercise does not cause premature birth. It will not cause your baby to weigh less at birth. What exercises can I do? Many exercises are safe for you to do during pregnancy. Do a variety of exercises that safely increase your heart and breathing rates and help you build and maintain muscle strength. Do exercises exactly as told by your health care provider. You may do these exercises:  Walking or hiking.  Swimming.  Water aerobics.  Riding a stationary bike.  Strength training.  Modified yoga or Pilates. Tell your instructor that you are pregnant. Avoid overstretching, and avoid lying on your back for long periods of time.  Running or jogging. Only choose this type of exercise if you: ? Ran or jogged regularly before your pregnancy. ? Can run or jog and still talk in complete sentences. What exercises should I avoid? Depending on your level of fitness and whether you exercised regularly before your pregnancy, you may be told to limit high-intensity exercise. You can tell that you are exercising at  a high intensity if you are breathing much harder and faster and cannot hold a conversation while exercising. You must avoid:  Contact sports.  Activities that put you at risk for falling on or being hit in the belly, such as downhill skiing, water skiing, surfing, rock climbing, cycling, gymnastics, and horseback riding.  Scuba diving.  Skydiving.  Yoga or Pilates in a room that is heated to high temperatures.  Jogging or running, unless you ran or jogged regularly before your pregnancy. While jogging or running, you should always be able to talk in full sentences. Do not run or jog so fast that you are unable to have a conversation.  Do not exercise at more than 6,000 feet above sea level (high elevation) if you are not used to exercising at high elevation. How do I exercise in a safe way?   Avoid overheating. Do not exercise in very high temperatures.  Wear loose-fitting, breathable clothes.  Avoid dehydration. Drink enough water before, during, and after exercise to keep your urine pale yellow.  Avoid overstretching. Because of hormone changes during pregnancy, it is easy to overstretch muscles, tendons, and ligaments during pregnancy.  Start slowly and ask your health care provider to recommend the types of exercise that are safe for you.  Do not exercise to lose weight. Follow these instructions at home:  Exercise on most days or all days of the week. Try to exercise for 30 minutes a day, 5 days a week, unless your health care provider tells you not to.  If you actively exercised before your pregnancy and you are healthy, your health care provider may tell you to continue to do moderate to high-intensity exercise.  If you are just starting to exercise or did not exercise much before your pregnancy, your health care provider may tell you to do low to moderate-intensity exercise. Questions to ask your health care provider  Is exercise safe for me?  What are signs that I  should stop exercising?  Does my health condition mean that I should not exercise during pregnancy?  When should I avoid exercising during pregnancy? Stop exercising and contact a health care provider if: You have any unusual symptoms, such as:  Mild contractions of the uterus or cramps in the abdomen.  Dizziness that does not go away when you rest. Stop exercising and get help right away if: You have any unusual symptoms, such   as:  Sudden, severe pain in your low back or your belly.  Mild contractions of the uterus or cramps in the abdomen that do not improve with rest and drinking fluids.  Chest pain.  Bleeding or fluid leaking from your vagina.  Shortness of breath. These symptoms may represent a serious problem that is an emergency. Do not wait to see if the symptoms will go away. Get medical help right away. Call your local emergency services (911 in the [place] Do not drive yourself to the hospital. Summary  Most women should exercise regularly throughout pregnancy. In rare cases, women with certain medical conditions or complications may be asked to limit or avoid exercise during pregnancy.  Do not exercise to lose weight during pregnancy.  Your health care provider will tell you what level of physical activity is right for you.  Stop exercising and contact a health care provider if you have mild contractions of the uterus or cramps in the abdomen. Get help right away if these contractions or cramps do not improve with rest and drinking fluids.  Stop exercising and get help right away if you have sudden, severe pain in your low back or belly, chest pain, shortness of breath, or bleeding or leaking of fluid from your vagina. This information is not intended to replace advice given to you by your health care provider. Make sure you discuss any questions you have with your health care provider. Document Released: 02/13/2005 Document Revised: 06/06/2018 Document Reviewed:  03/20/2018 Elsevier Patient Education  2020 [employer] Prenatal Care Prenatal care is health care during pregnancy. It helps you and your unborn baby (fetus) stay as healthy as possible. Prenatal care may be provided by a midwife, a family practice health care provider, or a childbirth and pregnancy specialist (obstetrician). How does this affect me? During pregnancy, you will be closely monitored for any new conditions that might develop. To lower your risk of pregnancy complications, you and your health care provider will talk about any underlying conditions you have. How does this affect my baby? Early and consistent prenatal care increases the chance that your baby will be healthy during pregnancy. Prenatal care lowers the risk that your baby will be:  Born early (prematurely).  Smaller than expected at birth (small for gestational age). What can I expect at the first prenatal care visit? Your first prenatal care visit will likely be the longest. You should schedule your first prenatal care visit as soon as you know that you are pregnant. Your first visit is a good time to talk about any questions or concerns you have about pregnancy. At your visit, you and your health care provider will talk about:  Your medical history, including: ? Any past pregnancies. ? Your family's medical history. ? The baby's father's medical history. ? Any long-term (chronic) health conditions you have and how you manage them. ? Any surgeries or procedures you have had. ? Any current over-the-counter or prescription medicines, herbs, or supplements you are taking.  Other factors that could pose a risk to your baby, including:  Your home setting and your stress levels, including: ? Exposure to abuse or violence. ? Household financial strain. ? Mental health conditions you have.  Your daily health habits, including diet and exercise. Your health care provider will also:  Measure your weight, height,  and blood pressure.  Do a physical exam, including a pelvic and breast exam.  Perform blood tests and urine tests to check for: ? Urinary tract infection. ?   Sexually transmitted infections (STIs). ? Low iron levels in your blood (anemia). ? Blood type and certain proteins on red blood cells (Rh antibodies). ? Infections and immunity to viruses, such as hepatitis B and rubella. ? HIV (human immunodeficiency virus).  Do an ultrasound to confirm your baby's growth and development and to help predict your estimated due date (EDD). This ultrasound is done with a probe that is inserted into the vagina (transvaginal ultrasound).  Discuss your options for genetic screening.  Give you information about how to keep yourself and your baby healthy, including: ? Nutrition and taking vitamins. ? Physical activity. ? How to manage pregnancy symptoms such as nausea and vomiting (morning sickness). ? Infections and substances that may be harmful to your baby and how to avoid them. ? Food safety. ? Dental care. ? Working. ? Travel. ? Warning signs to watch for and when to call your health care provider. How often will I have prenatal care visits? After your first prenatal care visit, you will have regular visits throughout your pregnancy. The visit schedule is often as follows:  Up to week 28 of pregnancy: once every 4 weeks.  28-36 weeks: once every 2 weeks.  After 36 weeks: every week until delivery. Some women may have visits more or less often depending on any underlying health conditions and the health of the baby. Keep all follow-up and prenatal care visits as told by your health care provider. This is important. What happens during routine prenatal care visits? Your health care provider will:  Measure your weight and blood pressure.  Check for fetal heart sounds.  Measure the height of your uterus in your abdomen (fundal height). This may be measured starting around week 20 of  pregnancy.  Check the position of your baby inside your uterus.  Ask questions about your diet, sleeping patterns, and whether you can feel the baby move.  Review warning signs to watch for and signs of labor.  Ask about any pregnancy symptoms you are having and how you are dealing with them. Symptoms may include: ? Headaches. ? Nausea and vomiting. ? Vaginal discharge. ? Swelling. ? Fatigue. ? Constipation. ? Any discomfort, including back or pelvic pain. Make a list of questions to ask your health care provider at your routine visits. What tests might I have during prenatal care visits? You may have blood, urine, and imaging tests throughout your pregnancy, such as:  Urine tests to check for glucose, protein, or signs of infection.  Glucose tests to check for a form of diabetes that can develop during pregnancy (gestational diabetes mellitus). This is usually done around week 24 of pregnancy.  An ultrasound to check your baby's growth and development and to check for birth defects. This is usually done around week 20 of pregnancy.  A test to check for group B strep (GBS) infection. This is usually done around week 36 of pregnancy.  Genetic testing. This may include blood or imaging tests, such as an ultrasound. Some genetic tests are done during the first trimester and some are done during the second trimester. What else can I expect during prenatal care visits? Your health care provider may recommend getting certain vaccines during pregnancy. These may include:  A yearly flu shot (annual influenza vaccine). This is especially important if you will be pregnant during flu season.  Tdap (tetanus, diphtheria, pertussis) vaccine. Getting this vaccine during pregnancy can protect your baby from whooping cough (pertussis) after birth. This vaccine may be recommended between   weeks 27 and 36 of pregnancy. Later in your pregnancy, your health care provider may give you information  about:  Childbirth and breastfeeding classes.  Choosing a health care provider for your baby.  Umbilical cord banking.  Breastfeeding.  Birth control after your baby is born.  The hospital labor and delivery unit and how to tour it.  Registering at the hospital before you go into labor. Where to find more information  Office on Women's Health: womenshealth.gov  American Pregnancy Association: americanpregnancy.org  March of Dimes: marchofdimes.org Summary  Prenatal care helps you and your baby stay as healthy as possible during pregnancy.  Your first prenatal care visit will most likely be the longest.  You will have visits and tests throughout your pregnancy to monitor your health and your baby's health.  Bring a list of questions to your visits to ask your health care provider.  Make sure to keep all follow-up and prenatal care visits with your health care provider. This information is not intended to replace advice given to you by your health care provider. Make sure you discuss any questions you have with your health care provider. Document Released: 02/16/2003 Document Revised: 06/05/2018 Document Reviewed: 02/12/2017 Elsevier Patient Education  2020 Elsevier Inc.    COVID-19 and Your Pregnancy FAQ  How can I prevent infection with COVID-19 during my pregnancy? Social distancing is key. Please limit any interactions in public. Try and work from home if possible. Frequently wash your hands after touching possibly contaminated surfaces. Avoid touching your face.  Minimize trips to the store. Consider online ordering when possible.   Should I wear a mask? YES. It is recommended by the CDC that all people wear a cloth mask or facial covering in public. You should wear a mask to your visits in the office. This will help reduce transmission as well as your risk or acquiring COVID-19. New studies are showing that even asymptomatic individuals can spread the virus from  talking.   Where can I get a mask? [place] and the city of [place] are partnering to provide masks to community members. You can pick up a mask from several locations. This website also has instructions about how to make a mask by sewing or without sewing by using a t-shirt or bandana.  https://www.[place].gov/i-want-to/learn-about/covid-19-information-and-updates/covid-19-face-mask-project  Studies have shown that if you were a tube or nylon stocking from pantyhose over a cloth mask it makes the cloth mask almost as effective as a N95 mask.  https://www.npr.org/sections/goatsandsoda/2018/06/19/840146830/adding-a-nylon-stocking-layer-could-boost-protection-from-cloth-masks-study-find  What are the symptoms of COVID-19? Fever (greater than 100.4 F), dry cough, shortness of breath.  Am I more at risk for COVID-19 since I am pregnant? There is not currently data showing that pregnant women are more adversely impacted by COVID-19 than the general population. However, we know that pregnant women tend to have worse respiratory complications from similar diseases such as the flu and SARS and for this reason should be considered an at-risk population.  What do I do if I am experiencing the symptoms of COVID-19? Testing is being limited because of test availability. If you are experiencing symptoms you should quarantine yourself, and the members of your family, for at least 2 weeks at home.   Please visit this website for more information: https://www.cdc.gov/coronavirus/2019-ncov/if-you-are-sick/steps-when-sick.html  When should I go to the Emergency Room? Please go to the emergency room if you are experiencing ANY of these symptoms*:  1.    Difficulty breathing or shortness of breath 2.    Persistent pain or   pressure in the chest 3.    Confusion or difficulty being aroused (or awakened) 4.    Bluish lips or face  *This list is not all inclusive. Please consult our office for any  other symptoms that are severe or concerning.  What do I do if I am having difficulty breathing? You should go to the Emergency Room for evaluation. At this time they have a tent set up for evaluating patients with COVID-19 symptoms.   How will my prenatal care be different because of the COVID-19 pandemic? It has been recommended to reduce the frequency of face-to-face visits and use resources such as telephone and virtual visits when possible. Using a scale, blood pressure machine and fetal doppler at home can further help reduce face-to-face visits. You will be provided with additional information on this topic.  We ask that you come to your visits alone to minimize potential exposures to  COVID-19.  How can I receive childbirth education? At this time in-person classes have been cancelled. You can register for online childbirth education, breastfeeding, and newborn care classes.  Please visit:  [email or web address] for more information  How will my hospital birth experience be different? The hospital is currently limiting visitors. This means that while you are in labor you can only have one person at the hospital with you. Additional family members will not be allowed to wait in the building or outside your room. Your one support person can be the father of the baby, a relative, a doula, or a friend. Once one support person is designated that person will wear a band. This band cannot be shared with multiple people.  Nitrous Gas is not being offered for pain relief since the tubing and filter for the machine can not be sanitized in a way to guarantee prevention of transmission of COVID-19.  Nasal cannula use of oxygen for fetal indications has also been discontinued.  Currently a clear plastic sheet is being hung between mom and the delivering provider during pushing and delivery to help prevent transmission of COVID-19.      How long will I stay in the hospital for after giving  birth? It is also recommended that discharge home be expedited during the COVID-19 outbreak. This means staying for 1 day after a vaginal delivery and 2 days after a cesarean section. Patients who need to stay longer for medical reasons are allowed to do so, but the goal will be for expedited discharge home.   What if I have COVID-19 and I am in labor? We ask that you wear a mask while on labor and delivery. We will try and accommodate you being placed in a room that is capable of filtering the air. Please call ahead if you are in labor and on your way to the hospital. The phone number for labor and delivery at [facility] is [phone].  If I have COVID-19 when my baby is born how can I prevent my baby from contracting COVID-19? This is an issue that will have to be discussed on a case-by-case basis. Current recommendations suggest providing separate isolation rooms for both the mother and new infant as well as limiting visitors. However, there are practical challenges to this recommendation. The situation will assuredly change and decisions will be influenced by the desires of the mother and availability of space.  Some suggestions are the use of a curtain or physical barrier between mom and infant, hand hygiene, mom wearing a mask, or 6 feet   of spacing between a mom and infant.   Can I breastfeed during the COVID-19 pandemic?   Yes, breastfeeding is encouraged.  Can I breastfeed if I have COVID-19? Yes. Covid-19 has not been found in breast milk. This means you cannot give COVID-19 to your child through breast milk. Breast feeding will also help pass antibodies to fight infection to your baby.   What precautions should I take when breastfeeding if I have COVID-19? If a mother and newborn do room-in and the mother wishes to feed at the breast, she should put on a facemask and practice hand hygiene before each feeding.  What precautions should I take when pumping if I have  COVID-19? Prior to expressing breast milk, mothers should practice hand hygiene. After each pumping session, all parts that come into contact with breast milk should be thoroughly washed and the entire pump should be appropriately disinfected per the manufacturer's instructions. This expressed breast milk should be fed to the newborn by a healthy caregiver.  What if I am pregnant and work in healthcare? Based on limited data regarding COVID-19 and pregnancy, ACOG currently does not propose creating additional restrictions on pregnant health care personnel because of COVID-19 alone. Pregnant women do not appear to be at higher risk of severe disease related to COVID-19. Pregnant health care personnel should follow CDC risk assessment and infection control guidelines for health care personnel exposed to patients with suspected or confirmed COVID-19. Adherence to recommended infection prevention and control practices is an important part of protecting all health care personnel in health care settings.    Information on COVID-19 in pregnancy is very limited; however, facilities may want to consider limiting exposure of pregnant health care personnel to patients with confirmed or suspected COVID-19 infection, especially during higher-risk procedures (eg, aerosol-generating procedures), if feasible, based on staffing availability.    

## 2018-09-03 NOTE — Progress Notes (Signed)
NOB 

## 2018-09-05 LAB — URINE CULTURE

## 2018-09-11 ENCOUNTER — Encounter: Payer: BC Managed Care – PPO | Admitting: Obstetrics and Gynecology

## 2018-09-11 ENCOUNTER — Ambulatory Visit: Payer: BC Managed Care – PPO

## 2018-09-23 ENCOUNTER — Encounter: Payer: Self-pay | Admitting: Obstetrics and Gynecology

## 2018-09-23 ENCOUNTER — Other Ambulatory Visit: Payer: Self-pay

## 2018-09-23 ENCOUNTER — Ambulatory Visit (INDEPENDENT_AMBULATORY_CARE_PROVIDER_SITE_OTHER): Payer: BC Managed Care – PPO

## 2018-09-23 ENCOUNTER — Ambulatory Visit (INDEPENDENT_AMBULATORY_CARE_PROVIDER_SITE_OTHER): Payer: BC Managed Care – PPO | Admitting: Obstetrics and Gynecology

## 2018-09-23 VITALS — BP 132/80 | Wt 318.0 lb

## 2018-09-23 DIAGNOSIS — O99210 Obesity complicating pregnancy, unspecified trimester: Secondary | ICD-10-CM

## 2018-09-23 DIAGNOSIS — Z3687 Encounter for antenatal screening for uncertain dates: Secondary | ICD-10-CM

## 2018-09-23 DIAGNOSIS — Z1379 Encounter for other screening for genetic and chromosomal anomalies: Secondary | ICD-10-CM

## 2018-09-23 DIAGNOSIS — O0990 Supervision of high risk pregnancy, unspecified, unspecified trimester: Secondary | ICD-10-CM

## 2018-09-23 DIAGNOSIS — O99211 Obesity complicating pregnancy, first trimester: Secondary | ICD-10-CM

## 2018-09-23 DIAGNOSIS — Z3A09 9 weeks gestation of pregnancy: Secondary | ICD-10-CM

## 2018-09-23 DIAGNOSIS — O0991 Supervision of high risk pregnancy, unspecified, first trimester: Secondary | ICD-10-CM

## 2018-09-23 DIAGNOSIS — Z6841 Body Mass Index (BMI) 40.0 and over, adult: Secondary | ICD-10-CM

## 2018-09-23 MED ORDER — FOLIC ACID 1 MG PO TABS: 1.0000 mg | ORAL_TABLET | Freq: Every day | ORAL | 10 refills | Status: AC

## 2018-09-23 NOTE — Progress Notes (Signed)
Routine Prenatal Care Visit  Subjective  Michele Scott is a 28 y.o. G1P0000 at [redacted]w[redacted]d being seen today for ongoing prenatal care.  She is currently monitored for the following issues for this high-risk pregnancy and has Supervision of high risk pregnancy, antepartum; Obesity affecting pregnancy, antepartum; and BMI 45.0-49.9, adult ([place]) on their problem list.  ----------------------------------------------------------------------------------- Patient reports no complaints.    .  .   . Denies leaking of fluid.  ----------------------------------------------------------------------------------- The following portions of the patient's history were reviewed and updated as appropriate: allergies, current medications, past family history, past medical history, past social history, past surgical history and problem list. Problem list updated.   Objective  Blood pressure 132/80, weight (!) 318 lb (144.2 kg), last menstrual period 07/18/2018. Pregravid weight 312 lb (141.5 kg) Total Weight Gain 6 lb (2.722 kg) Urinalysis:      Fetal Status:           General:  Alert, oriented and cooperative. Patient is in no acute distress.  Skin: Skin is warm and dry. No rash noted.   Cardiovascular: Normal heart rate noted  Respiratory: Normal respiratory effort, no problems with respiration noted  Abdomen: Soft, gravid, appropriate for gestational age.       Pelvic:  Cervical exam deferred        Extremities: Normal range of motion.     Mental Status: Normal mood and affect. Normal behavior. Normal judgment and thought content.     Assessment   28 y.o. G1P0000 at [redacted]w[redacted]d by  04/24/2019, by Last Menstrual Period presenting for routine prenatal visit  Plan   Pregnancy#1 Problems (from 07/18/18 to present)    Problem Noted Resolved   Supervision of high risk pregnancy, antepartum 09/03/2018 by Rod Can, CNM No   Overview Addendum 09/23/2018 12:44 PM by Homero Fellers, MD    Clinic  [facility]  Dating  LMP = 9wk [place] Blood type:     Genetic Screen 1 Screen:    AFP:     Antibody:   Anatomic [place]  Rubella:   Varicella: @VZVIGG @  GTT Early:               Third trimester:  RPR: Non Reactive (05/14 1610)   Rhogam  HBsAg: Negative (05/14 1610)   TDaP vaccine                       Flu Shot: HIV: Non Reactive (05/14 1610)   Baby Food  Breast                              GBS:   Contraception  Pap:  CBB   Chlamydia positive in May, tx'ed 7/7  CS/VBAC NA  Hep C positive  Support Person Elberta Fortis          Obesity affecting pregnancy, antepartum 09/03/2018 by Rod Can, CNM No       Gestational age appropriate obstetric precautions including but not limited to vaginal bleeding, contractions, leaking of fluid and fetal movement were reviewed in detail with the patient.    Reports took meds for chlamydia about 2-3 weeks ago.  Will need TOC for chlamydia Will not need colposcopy, ASCUS HPV negative pap  Declines labs today.  Labs ordered for next visit. Needs NOB panel and Hep C testing, baseline CMP and hgb electrophoresis.  Desires nuchal translucency screening Will return for 1 GTT Advised to start 1mg   folic acid supplementation Will need MFM referral next visit Consider ID or GI referral if Hepatitis C is confirmed   Return in about 2 weeks (around 10/07/2018) for 1GTT, ROB, US.  Natale Milchhristanna R  MD [facility], [facility] 09/23/2018, 12:15 PM

## 2018-09-23 NOTE — Progress Notes (Signed)
ROB/US C/o nausea and vomiting

## 2018-09-30 ENCOUNTER — Ambulatory Visit: Payer: Self-pay | Admitting: Dietician

## 2018-10-07 ENCOUNTER — Other Ambulatory Visit: Payer: BC Managed Care – PPO

## 2018-10-07 ENCOUNTER — Encounter: Payer: BC Managed Care – PPO | Admitting: Obstetrics and Gynecology

## 2021-06-14 ENCOUNTER — Emergency Department: Payer: BC Managed Care – PPO

## 2021-06-14 ENCOUNTER — Other Ambulatory Visit: Payer: Self-pay

## 2021-06-14 ENCOUNTER — Emergency Department
Admission: EM | Admit: 2021-06-14 | Discharge: 2021-06-14 | Disposition: A | Discharge status: Home / Self Care | Payer: BC Managed Care – PPO | Attending: Emergency Medicine | Admitting: Emergency Medicine

## 2021-06-14 DIAGNOSIS — N939 Abnormal uterine and vaginal bleeding, unspecified: Secondary | ICD-10-CM | POA: Diagnosis present

## 2021-06-14 DIAGNOSIS — N8003 Adenomyosis of the uterus: Secondary | ICD-10-CM | POA: Insufficient documentation

## 2021-06-14 LAB — CBC WITH DIFFERENTIAL/PLATELET
Abs Immature Granulocytes: 0.04 10*3/uL (ref 0.00–0.07)
Basophils Absolute: 0 10*3/uL (ref 0.0–0.1)
Basophils Relative: 0 %
Eosinophils Absolute: 0.2 10*3/uL (ref 0.0–0.5)
Eosinophils Relative: 2 %
HCT: 39.7 % (ref 36.0–46.0)
Hemoglobin: 13.1 g/dL (ref 12.0–15.0)
Immature Granulocytes: 0 %
Lymphocytes Relative: 30 %
Lymphs Abs: 2.8 10*3/uL (ref 0.7–4.0)
MCH: 29.8 pg (ref 26.0–34.0)
MCHC: 33 g/dL (ref 30.0–36.0)
MCV: 90.2 fL (ref 80.0–100.0)
Monocytes Absolute: 0.7 10*3/uL (ref 0.1–1.0)
Monocytes Relative: 7 %
Neutro Abs: 5.6 10*3/uL (ref 1.7–7.7)
Neutrophils Relative %: 61 %
Platelets: 268 10*3/uL (ref 150–400)
RBC: 4.4 MIL/uL (ref 3.87–5.11)
RDW: 12.4 % (ref 11.5–15.5)
WBC: 9.3 10*3/uL (ref 4.0–10.5)
nRBC: 0 % (ref 0.0–0.2)

## 2021-06-14 LAB — BASIC METABOLIC PANEL
Anion gap: 8 (ref 5–15)
BUN: 11 mg/dL (ref 6–20)
CO2: 25 mmol/L (ref 22–32)
Calcium: 9.2 mg/dL (ref 8.9–10.3)
Chloride: 104 mmol/L (ref 98–111)
Creatinine, Ser: 0.87 mg/dL (ref 0.44–1.00)
GFR, Estimated: >60 mL/min (ref >60)
Glucose, Bld: 116 mg/dL — ABNORMAL HIGH (ref 70–99)
Potassium: 3.9 mmol/L (ref 3.5–5.1)
Sodium: 137 mmol/L (ref 135–145)

## 2021-06-14 LAB — POC URINE PREG, ED: Preg Test, Ur: NEGATIVE

## 2021-06-14 NOTE — ED Notes (Signed)
Poct pregnancy Negative 

## 2021-06-14 NOTE — ED Triage Notes (Signed)
Pt to ED for vaginal bleeding since February. States usually gets periods monthly. Last normal period was in January, then after period started in February her bleeding has been continuous, at times with light spotting and at times heavy. ? ?States could not be pregnant, has not had sex for 1 year. ? ?Denies dizziness but does feel fatigued. Is having cramping in lower abdomen.  ?

## 2021-06-14 NOTE — Discharge Instructions (Signed)
As we discussed please follow-up with your OB/GYN and bring your printed ultrasound report.  Return to the emergency department for any significant increase in bleeding.  Or any other symptom personally concerning to yourself. ?

## 2021-06-14 NOTE — ED Provider Notes (Signed)
? ?[facility] ?Provider Note ? ? ? Event Date/Time  ? First MD Initiated Contact with Patient 06/14/21 1530   ?  (approximate) ? ?History  ? ?Chief Complaint: Vaginal Bleeding ? ?HPI ? ?Michele Scott is a 31 y.o. female with a past medical history of obesity presents to the emergency department for vaginal bleeding.  According to the patient since February she has had frequent vaginal bleeding near daily per patient.  States occasional lower abdominal cramping at times as well.  Patient states prior to February her menstrual cycles were normal/regular.  Patient denies any possibility pregnancy.  Patient saw her OB/GYN 1 week ago for the same and was placed on Sprintec OCP.  Patient states she has been taking them for 1 week but continues to have vaginal bleeding so she came to the emergency department. ? ?Physical Exam  ? ?Triage Vital Signs: ?ED Triage Vitals  ?Enc Vitals Group  ?   BP 06/14/21 1503 (!) 149/93  ?   Pulse Rate 06/14/21 1503 95  ?   Resp 06/14/21 1503 16  ?   Temp 06/14/21 1503 98.1 ?F (36.7 ?C)  ?   Temp Source 06/14/21 1503 Oral  ?   SpO2 06/14/21 1503 100 %  ?   Weight 06/14/21 1500 (!) 360 lb (163.3 kg)  ?   Height 06/14/21 1500 5\' 8"  (1.727 m)  ?   Head Circumference --   ?   Peak Flow --   ?   Pain Score 06/14/21 1459 5  ?   Pain Loc --   ?   Pain Edu? --   ?   Excl. in GC? --   ? ? ?Most recent vital signs: ?Vitals:  ? 06/14/21 1503  ?BP: (!) 149/93  ?Pulse: 95  ?Resp: 16  ?Temp: 98.1 ?F (36.7 ?C)  ?SpO2: 100%  ? ? ?General: Awake, no distress.  ?CV:  Good peripheral perfusion.  Regular rate and rhythm  ?Resp:  Normal effort.  Equal breath sounds bilaterally.  ?Abd:  No distention.  Soft, nontender.  No rebound or guarding.  No tenderness to abdominal palpation. ? ? ? ?ED Results / Procedures / Treatments  ? ?RADIOLOGY ? ?Ultrasound has resulted showing ?IMPRESSION:  ?1. Heterogeneous uterus with findings of adenomyosis.  ?2. Focal vascularity in the anterior mid  endometrium. Further  ?evaluation with hysteroscopy or hysterosonography is recommended.  ?3. Unremarkable ovaries.  ? ? ?MEDICATIONS ORDERED IN ED: ?Medications - No data to display ? ? ?IMPRESSION / MDM / ASSESSMENT AND PLAN / ED COURSE  ?I reviewed the triage vital signs and the nursing notes. ? ?Patient presents to the emergency department for several months of vaginal bleeding that occurs most days per patient.  Patient was to started on Sprintec 1 week ago by OB/GYN.  Patient states she continued to bleed so she came to the emergency department today for evaluation worried that her blood counts to be low.  Patient's labs are reassuring today with a normal CBC, hemoglobin of 13 and a normal chemistry.  We will obtain a pelvic ultrasound to further evaluate for other causes of vaginal bleeding such as fibroids.  Given the patient's reassuring lab work and recent OB/GYN follow-up I believe the patient would be safe to follow-up with OB/GYN. ? ?Ultrasound shows adenomyosis but also focal vascularity in the mid endometrium.  Discussed with the patient these findings we will print out the ultrasound report and the patient states she will follow-up with her  OB/GYN at [facility].  Discussed return precautions.  Patient agreeable to plan.  Patient will continue taking her Sprintec as recently prescribed by her OB/GYN. ? ?FINAL CLINICAL IMPRESSION(S) / ED DIAGNOSES  ? ?Vaginal bleeding ? ? ? ?Note:  This document was prepared using Dragon voice recognition software and may include unintentional dictation errors. ?  ?Minna Antis, MD ?06/14/21 1947 ? ?

## 2022-08-20 IMAGING — US US PELVIS COMPLETE WITH TRANSVAGINAL
1 series · 13 of 25 positions shown · non-contrast
Comparison: Ultrasound dated 09/23/2018.

CLINICAL DATA: Vaginal bleeding.

EXAM:
TRANSABDOMINAL AND TRANSVAGINAL ULTRASOUND OF PELVIS
TECHNIQUE: Both transabdominal and transvaginal ultrasound examinations of the
pelvis were performed. Transabdominal technique was performed for
global imaging of the pelvis including uterus, ovaries, adnexal
regions, and pelvic cul-de-sac. It was necessary to proceed with
endovaginal exam following the transabdominal exam to visualize the
knee me trim and ovaries.

[Series 1: us pelvic complete with transvaginal · 78 acquisitions, 13 frames shown]
[im 1/78]
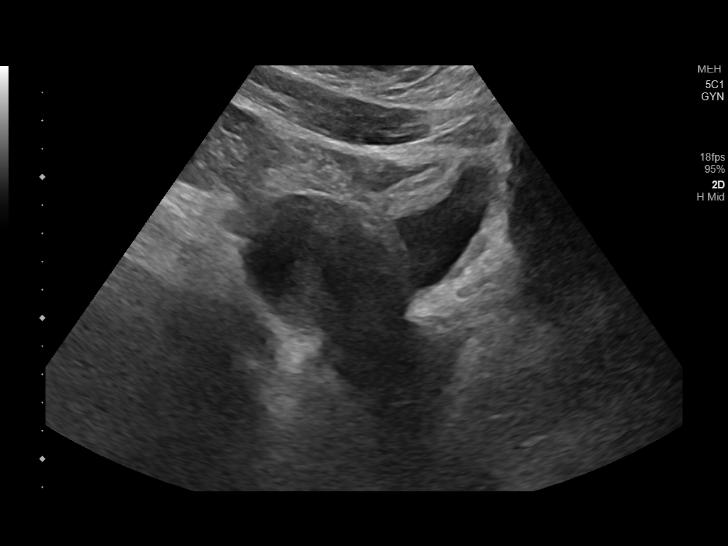
[im 7/78]
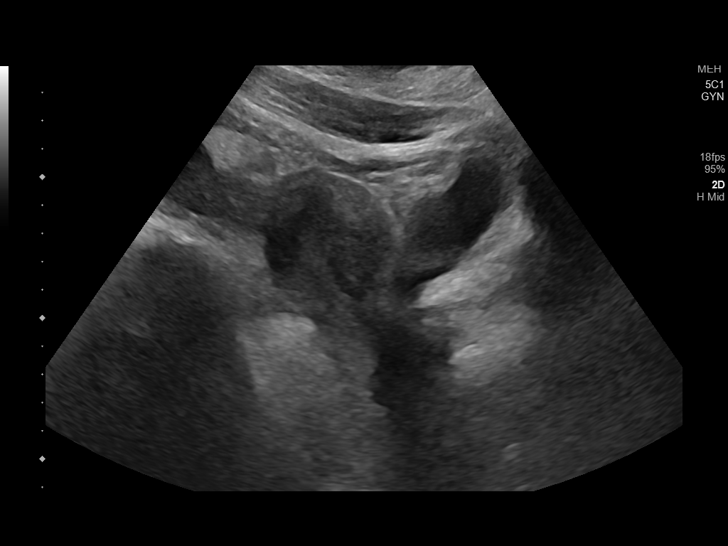
[im 13/78]
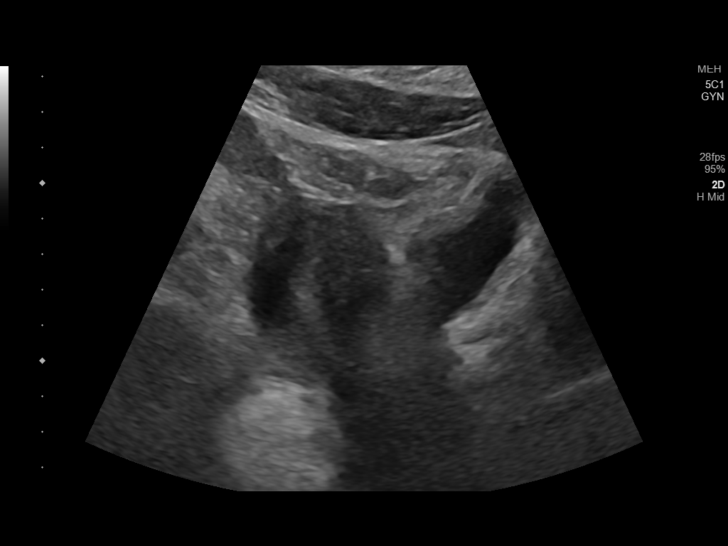
[im 20/78]
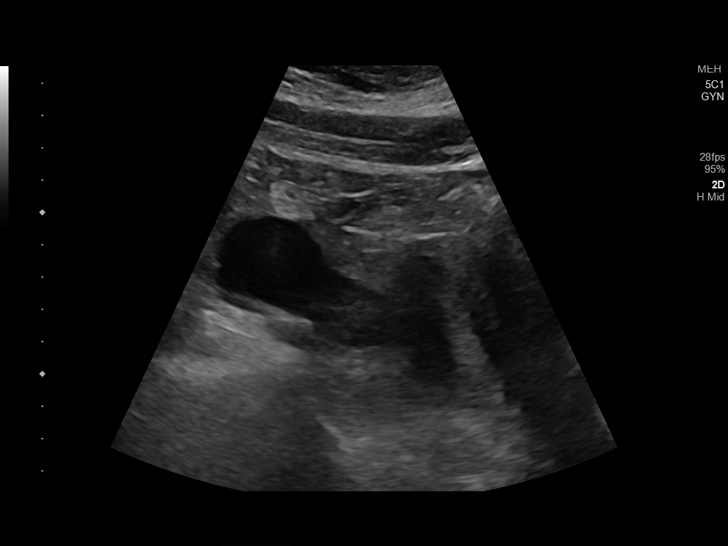
[im 26/78]
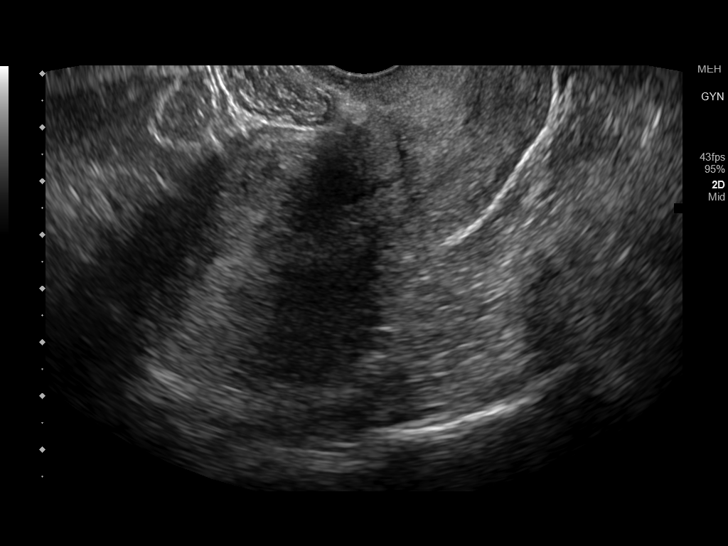
[im 33/78]
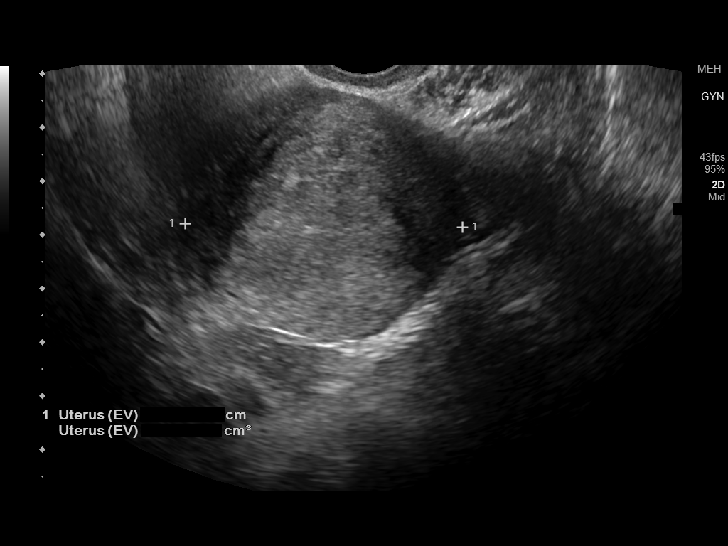
[im 39/78]
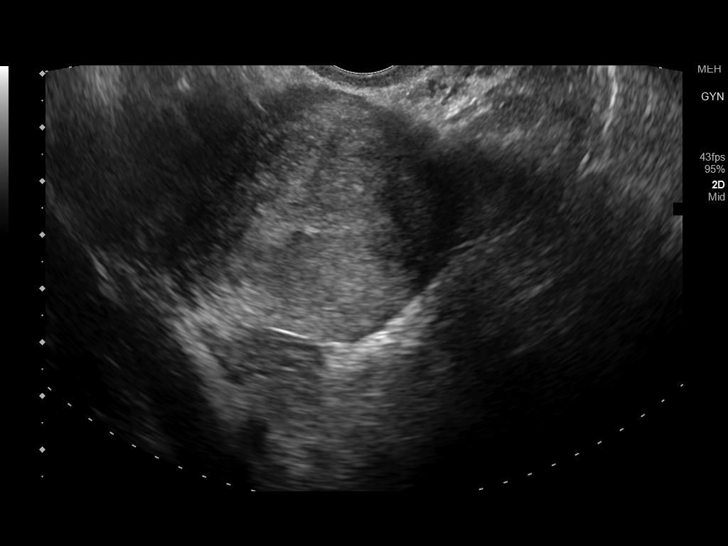
[im 45/78]
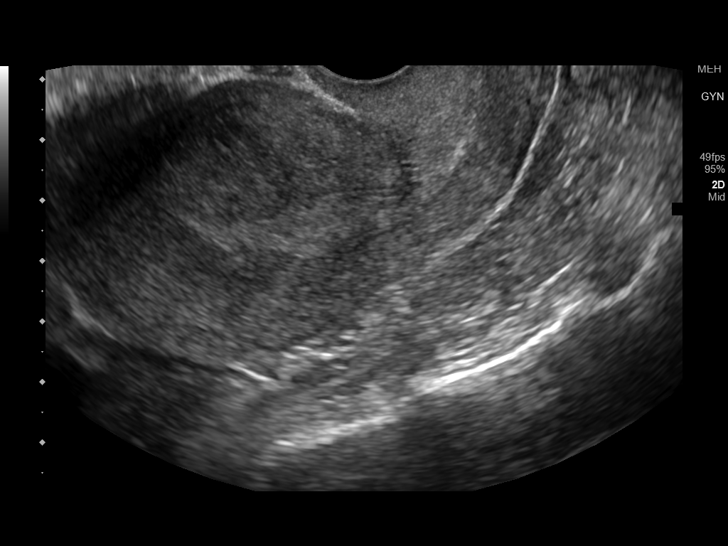
[im 52/78]
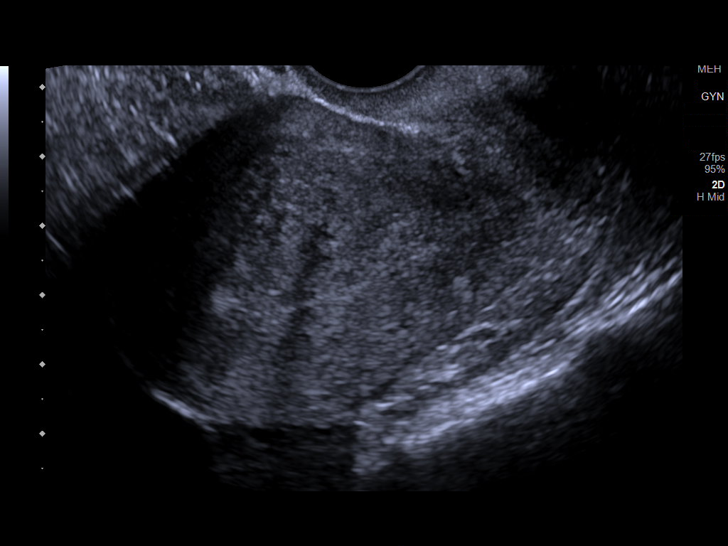
[im 58/78]
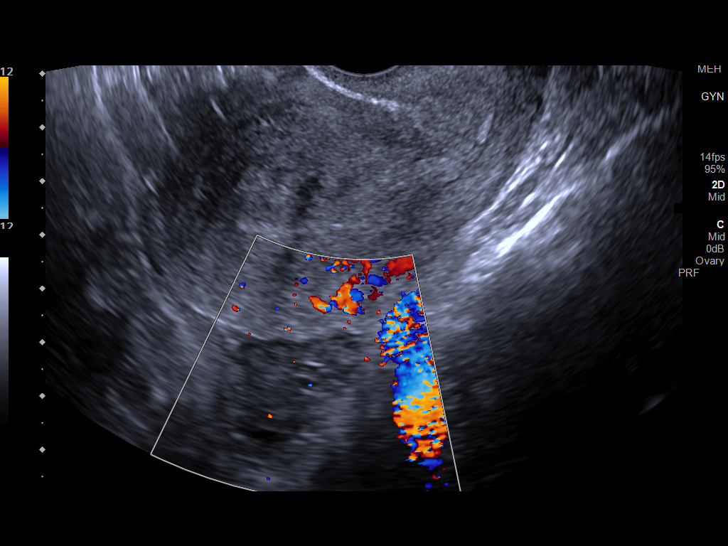
[im 65/78]
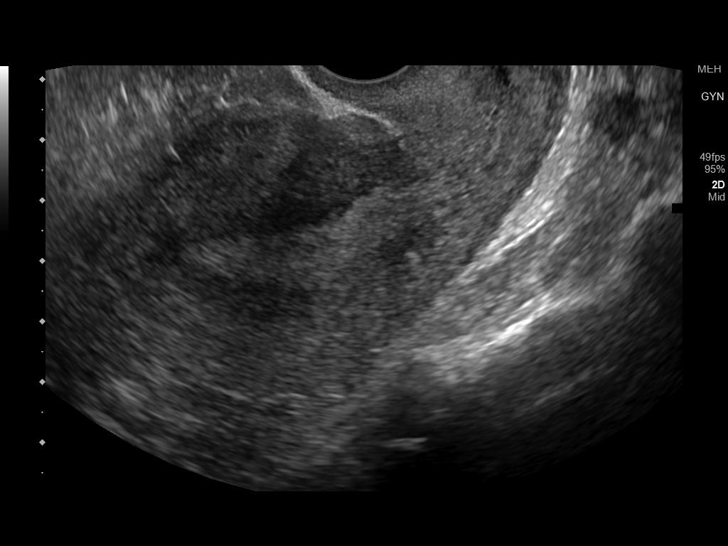
[im 71/78]
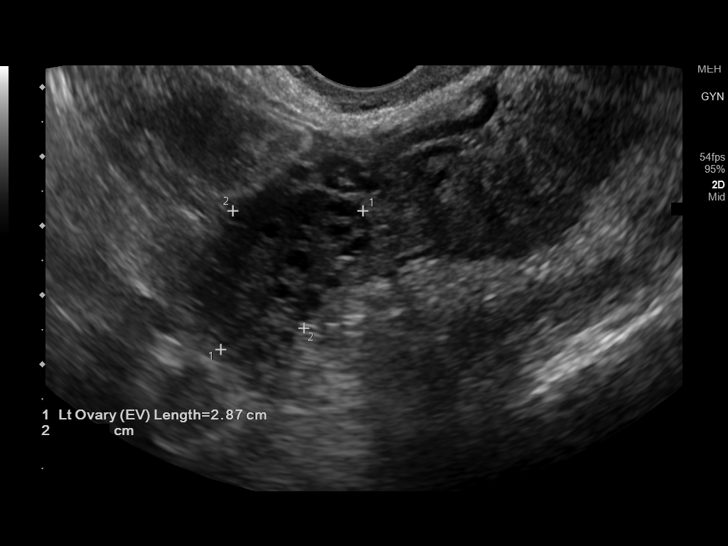
[im 78/78]
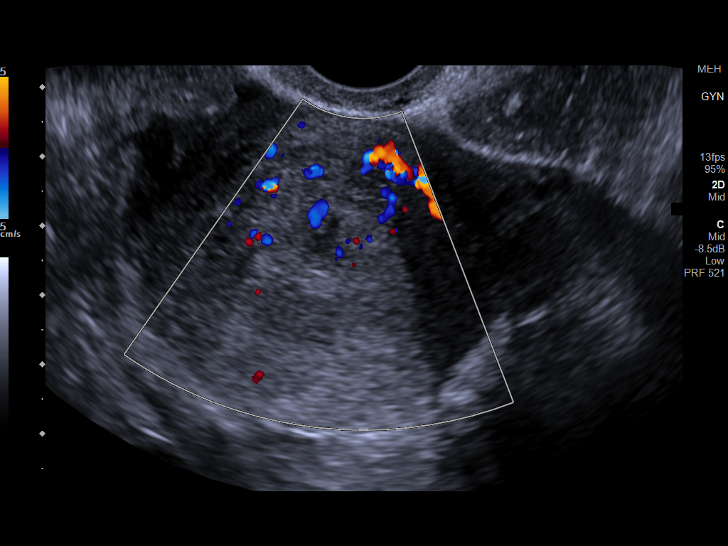

[13 of 25 positions shown; findings below may reference images not displayed]

FINDINGS: Uterus

Measurements: 9.0 x 4.5 x 5.2 cm = volume: 109 mL. The uterus is
anteverted and demonstrates a heterogeneous echotexture with
findings suggestive of adenomyosis.

Endometrium

Thickness: 10 mm. The endometrium is slightly heterogeneous. There
is focal area of vascularity with possible vascular stalk in the
anterior mid endometrium. Underlying endometrial lesion or polyp is
not excluded. Further evaluation with hysteroscopy or
hysterosonography is recommended.

Right ovary

Measurements: The 2.6 x 1.9 x 2.2 cm = volume: 5.9 mL. There is a 3
cm dominant follicle or cyst in the right ovary, physiologic. No
follow-up recommended.

Left ovary

Measurements: 2.9 x 1.7 x 1.6 cm = volume: 4.2 mL. Normal
appearance/no adnexal mass.

Other findings

No abnormal free fluid.
IMPRESSION: 1. Heterogeneous uterus with findings of adenomyosis.
2. Focal vascularity in the anterior mid endometrium. Further
evaluation with hysteroscopy or hysterosonography is recommended.
3. Unremarkable ovaries.

## 2023-06-08 ENCOUNTER — Emergency Department
Admission: EM | Admit: 2023-06-08 | Discharge: 2023-06-08 | Disposition: A | Discharge status: Home / Self Care | Payer: Self-pay | Attending: Emergency Medicine | Admitting: Emergency Medicine

## 2023-06-08 ENCOUNTER — Encounter: Payer: Self-pay | Admitting: Emergency Medicine

## 2023-06-08 ENCOUNTER — Other Ambulatory Visit: Payer: Self-pay

## 2023-06-08 DIAGNOSIS — N938 Other specified abnormal uterine and vaginal bleeding: Secondary | ICD-10-CM | POA: Insufficient documentation

## 2023-06-08 DIAGNOSIS — N926 Irregular menstruation, unspecified: Secondary | ICD-10-CM | POA: Insufficient documentation

## 2023-06-08 DIAGNOSIS — X58XXXA Exposure to other specified factors, initial encounter: Secondary | ICD-10-CM | POA: Insufficient documentation

## 2023-06-08 DIAGNOSIS — S0501XA Injury of conjunctiva and corneal abrasion without foreign body, right eye, initial encounter: Secondary | ICD-10-CM | POA: Insufficient documentation

## 2023-06-08 LAB — CBC
HCT: 39.9 % (ref 36.0–46.0)
Hemoglobin: 13.3 g/dL (ref 12.0–15.0)
MCH: 30 pg (ref 26.0–34.0)
MCHC: 33.3 g/dL (ref 30.0–36.0)
MCV: 90.1 fL (ref 80.0–100.0)
Platelets: 296 10*3/uL (ref 150–400)
RBC: 4.43 MIL/uL (ref 3.87–5.11)
RDW: 13.3 % (ref 11.5–15.5)
WBC: 9.8 10*3/uL (ref 4.0–10.5)
nRBC: 0 % (ref 0.0–0.2)

## 2023-06-08 LAB — POC URINE PREG, ED: Preg Test, Ur: NEGATIVE

## 2023-06-08 MED ORDER — LORATADINE 10 MG PO TABS
10.0000 mg | ORAL_TABLET | Freq: Once | ORAL | Status: AC
Start: 2023-06-08 — End: 2023-06-08
  Administered 2023-06-08: 10 mg via ORAL
  Filled 2023-06-08: qty 1

## 2023-06-08 MED ORDER — ERYTHROMYCIN 5 MG/GM OP OINT
TOPICAL_OINTMENT | Freq: Four times a day (QID) | OPHTHALMIC | Status: DC
  Administered 2023-06-08: 1 via OPHTHALMIC
  Filled 2023-06-08: qty 1

## 2023-06-08 MED ORDER — FLUORESCEIN SODIUM 1 MG OP STRP
1.0000 | ORAL_STRIP | Freq: Once | OPHTHALMIC | Status: AC
Start: 2023-06-08 — End: 2023-06-08
  Administered 2023-06-08: 1 via OPHTHALMIC
  Filled 2023-06-08: qty 1

## 2023-06-08 NOTE — ED Notes (Signed)
 Presents with bilateral eye irritation   Right is swollen  Having some sensitivity to light  Denies any injury Also states she has had her period since 2/14   Denies any pain  States the flow changes from light to heavy

## 2023-06-08 NOTE — ED Notes (Signed)
 Patient given a cup of water, and a urine cup for sample collection.

## 2023-06-08 NOTE — ED Notes (Signed)
 Pt states that she wears contacts, and took the right one out this morning. States that she did rub her eyes quite a bit yesterday. Pt is alert and oriented x4, with no signs of acute distress at this time.

## 2023-06-08 NOTE — ED Provider Notes (Signed)
 [facility] Provider Note    Event Date/Time   First MD Initiated Contact with Patient 06/08/23 0715     (approximate)   History   Eye Problem and Vaginal Bleeding   HPI  Michele Scott is a 33 y.o. female here for evaluation of right eye irritation.  She reports she is a contact lens user she does not wear contact today.  Over the last 2 days she has noticed an irritated feeling in her right eye.  She did not lose to contact does not have a feeling something stuck in her eye.  eyelid  just slightly swollen, her eye has been slightly red, and she has noticed some clear drainage from around it.  No eye pain no change in vision.  No thick matting.  No redness of the face or fever  Patient feels like something is irritating her right eye.  Additionally she reports that for several months now she has had irregular menstrual cycles.  Her last menstrual cycle has since ended, she reports her last cycle was around mid February and she bled for 4 days.  Not any abdominal pain not having active bleeding now.  She reports she meant to come in to get this checked out for a while now, does not currently have a primary doctor     Physical Exam   Triage Vital Signs: ED Triage Vitals  Encounter Vitals Group     BP 06/08/23 0708 (!) 157/103     Systolic BP Percentile --      Diastolic BP Percentile --      Pulse Rate 06/08/23 0708 99     Resp 06/08/23 0708 18     Temp 06/08/23 0708 98.9 F (37.2 C)     Temp Source 06/08/23 0708 Oral     SpO2 06/08/23 0708 99 %     Weight 06/08/23 0706 (!) 350 lb (158.8 kg)     Height 06/08/23 0706 5\' 8"  (1.727 m)     Head Circumference --      Peak Flow --      Pain Score 06/08/23 0706 6     Pain Loc --      Pain Education --      Exclude from Growth Chart --     Most recent vital signs: Vitals:   06/08/23 0708  BP: (!) 157/103  Pulse: 99  Resp: 18  Temp: 98.9 F (37.2 C)  SpO2: 99%     General: Awake, no  distress.   Extraocular movements are normal.  No evidence of.  However no erythema warmth to touch or findings that would suggest acute septal or preseptal cellulitis.  Her right eye does have slight clear tearing from it.  Additionally the right conjunctiva slightly injected.  Fluorescein staining performed, no uptake over the cornea, but over the conjunctiva medially there is a segment that is approximately 2 x 3 cm and shows slight uptake without sharp demarcation.  Suspicious for possible abrasion.  No foreign bodies.  Patient is not wearing contacts in the left eye today  CV:  Good peripheral perfusion.  Resp:  Normal effort.  Abd:  No distention.  Soft nontender nondistended Other:     ED Results / Procedures / Treatments   Labs (all labs ordered are listed, but only abnormal results are displayed) Labs Reviewed  CBC  POC URINE PREG, ED     EKG     RADIOLOGY     PROCEDURES:  Critical  Care performed: No  Procedures   MEDICATIONS ORDERED IN ED: Medications  erythromycin ophthalmic ointment (1 Application Right Eye Given 06/08/23 0807)  loratadine (CLARITIN) tablet 10 mg (10 mg Oral Given 06/08/23 0724)  fluorescein ophthalmic strip 1 strip (1 strip Both Eyes Given by Other 06/08/23 0728)     IMPRESSION / MDM / ASSESSMENT AND PLAN / ED COURSE  I reviewed the triage vital signs and the nursing notes.                              No findings to suspect acute pathology that would lead to immediate loss of eye or blindness.  Reassuring exam.  No evidence of cellulitis.  Suspect small corneal abrasion likely related to use of contact lenses not overlying the cornea.  Denies change in vision.  Additionally would consider she may have a very slight amount of surrounding blepharitis but no stye is noted.  No abscess.  Regarding vaginal symptoms she is currently asymptomatic.  She reports her last menstrual cycle was a month and a half ago with irregularity.  Will check  CBC to ensure no acute or severe worsening anemia.  Recommended that she follow-up closely with the OB/GYN doctor, and patient agreeable   Patient's presentation is most consistent with acute complicated illness / injury requiring diagnostic workup.  Lab interpreted as normal CBC and no anemia.  Negative pregnancy test      They are instructed to the patient specifically that she is not to wear her contact lenses for at least 3 to 4 weeks.  She is to use her eyeglasses.  Additionally I instructed her to set up a follow-up visit either today or Monday with [facility] for reevaluation and further characterization with a specialist.  She is agreeable to both.  Provided erythromycin 4 times a day for use of the right eye.  Also recommended she may wish to try tea tree oil lightly around the orbit and see if this helps also trial Claritin in the event seasonal allergies may also be present.  High pollen count    FINAL CLINICAL IMPRESSION(S) / ED DIAGNOSES   Final diagnoses:  Conjunctival abrasion, right, initial encounter  Irregular periods     Rx / DC Orders   ED Discharge Orders     None        Note:  This document was prepared using Dragon voice recognition software and may include unintentional dictation errors.   Sharyn Creamer, MD 06/08/23 [phone]

## 2023-06-08 NOTE — ED Triage Notes (Signed)
 Pt via POV from home. Pt c/o bilateral eye pain and irritation. Denies any foreign object or substance in pt eyes, irritation started yesterday. Denies any drainage. States it is hard to keep her eyes open. Pt also c/o vaginal bleeding states she started bleeding on 2/14 and has not stopped bleeding since then, reports some abd cramping. States there had only been 4 consecutive days she had not bleed since 2/14. Pt is A&Ox4 and NAD, ambulatory to triage.

## 2023-06-08 NOTE — Discharge Instructions (Addendum)
 Do not use contact lenses for at least the next 3 weeks.  Follow-up with ophthalmologist by securing appointment either later today or on Monday, call them to schedule this.

## 2023-08-29 ENCOUNTER — Encounter: Payer: Self-pay | Admitting: Family Medicine

## 2023-08-29 ENCOUNTER — Ambulatory Visit: Payer: Self-pay | Admitting: Family Medicine

## 2023-08-29 DIAGNOSIS — Z113 Encounter for screening for infections with a predominantly sexual mode of transmission: Secondary | ICD-10-CM

## 2023-08-29 DIAGNOSIS — F32A Depression, unspecified: Secondary | ICD-10-CM

## 2023-08-29 LAB — WET PREP FOR TRICH, YEAST, CLUE
Clue Cell Exam: NEGATIVE
Trichomonas Exam: NEGATIVE
Yeast Exam: NEGATIVE

## 2023-08-29 LAB — HM HIV SCREENING LAB: HM HIV Screening: NEGATIVE

## 2023-08-29 NOTE — Progress Notes (Signed)
 [facility] Department STI clinic [address] [place] [address] Main phone: [phone]  STI screening visit  Subjective:  Michele Scott is a 33 y.o. female being seen today for an STI screening visit. The patient reports they do not have symptoms.  Patient reports that they do not desire a pregnancy in the next year.   They reported they are not interested in discussing contraception today.    No LMP recorded. (Menstrual status: Irregular Periods).  Patient has the following medical conditions:  Patient Active Problem List   Diagnosis Date Noted   Supervision of high risk pregnancy, antepartum 09/03/2018   Obesity affecting pregnancy, antepartum 09/03/2018   BMI 45.0-49.9, adult (HCC) 09/03/2018   Chief Complaint  Patient presents with   SEXUALLY TRANSMITTED DISEASE    HPI Patient reports to clinic for STI testing -states she found out her partner had a new partner. Denies symptoms. Reports hx of Chlamydia years ago.  Does the patient using douching products? No  See flowsheet for further details and programmatic requirements Hyperlink available at the top of the signed note in blue.  Flow sheet content below:  Pregnancy Intention Screening Does the patient want to become pregnant in the next year?: Unsure Does the patient's partner want to become pregnant in the next year?: Unsure Would the patient like to discuss contraceptive options today?: No All Patients Anyone smoke around pt and/or pt's children?: Yes Anyone smoke inside pt's house?: No Anyone smoke inside car?: No Anyone smoke inside the workplace?: No Reason For STD Screen STD Screening: Is asymptomatic Have you ever had an STD?: Yes History of Antibiotic use in the past 2 weeks?: No STD Symptoms Denies all: Yes Risk Factors for Hep B Household, sexual, or needle sharing contact of a person infected with Hep B: No Sexual contact with a person who uses drugs not  as prescribed?: No Currently or Ever used drugs not as prescribed: No HIV Positive: No PRep Patient: No Men who have sex with men: No Have Hepatitis C: No History of Incarceration: No History of Homeslessness?: No Anal sex following anal drug use?: No Risk Factors for Hep C Currently using drugs not as prescribed: No Sexual partner(s) currently using drugs as not prescribed: No History of drug use: No HIV Positive: No People with a history of incarceration: No People born between the years of 4 and 67: No Advise Advised client to quit or stay quit. : Yes (MJ) Abuse History Has patient ever been abused physically?: No Has patient ever been abused sexually?: No Does patient feel they have a problem with Anxiety?: No Does patient feel they have a problem with Depression?: Yes Referral to Behavioral Health: Yes Counseling Patient counseled to use condoms with all sex: Condoms given RTC in 2-3 weeks for test results: Yes Clinic will call if test results abnormal before test result appt.: Yes Test results given to patient Patient counseled to use condoms with all sex: Condoms given   Screening for MPX risk: Does the patient have an unexplained rash? No Is the patient MSM? No Does the patient endorse multiple sex partners or anonymous sex partners? No Did the patient have close or sexual contact with a person diagnosed with MPX? No Has the patient traveled outside the [place]  where MPX is endemic? No Is there a high clinical suspicion for MPX-- evidenced by one of the following No  -Unlikely to be chickenpox  -Lymphadenopathy  -Rash that present in same phase of  evolution on any given body part  Screenings: Last HIV test per patient/review of record was No results found for: HMHIVSCREEN  Lab Results  Component Value Date   HIV Non Reactive 07/11/2018     Last HEPC test per patient/review of record was No results found for: HMHEPCSCREEN No components found for: HEPC    Last HEPB test per patient/review of record was No components found for: HMHEPBSCREEN   Patient reports last pap was:   No results found for: SPECADGYN Result Date Procedure Results Follow-ups  07/11/2018 Cytology - PAP Adequacy: Satisfactory for evaluation  endocervical/transformation zone component PRESENT. (A) Diagnosis: ATYPICAL SQUAMOUS CELLS OF UNDETERMINED SIGNIFICANCE (ASC-US ). (A) Chlamydia: POSITIVE (A) Neisseria Gonorrhea: Negative HPV: NOT DETECTED Material Submitted: CervicoVaginal Pap [ThinPrep Imaged] (A) CYTOLOGY - PAP: PAP RESULT     Immunization history:   There is no immunization history on file for this patient.  The following portions of the patient's history were reviewed and updated as appropriate: allergies, current medications, past medical history, past social history, past surgical history and problem list.  Objective:  There were no vitals filed for this visit.  Physical Exam Vitals and nursing note reviewed.  Constitutional:      Appearance: She is obese.  HENT:     Head: Normocephalic.     Mouth/Throat:     Mouth: Mucous membranes are moist.  Cardiovascular:     Rate and Rhythm: Normal rate.  Pulmonary:     Effort: Pulmonary effort is normal.  Abdominal:     Palpations: Abdomen is soft.  Genitourinary:    Comments: Declined genital exam- no symptoms, self swabbed Musculoskeletal:        General: Normal range of motion.  Lymphadenopathy:     Head:     Right side of head: No submandibular, preauricular or posterior auricular adenopathy.     Left side of head: No submandibular, preauricular or posterior auricular adenopathy.     Cervical: No cervical adenopathy.     Upper Body:     Right upper body: No supraclavicular or axillary adenopathy.     Left upper body: No supraclavicular or axillary adenopathy.  Skin:    General: Skin is warm and dry.  Neurological:     Mental Status: She is alert and oriented to person, place, and time.   Psychiatric:        Mood and Affect: Mood normal.      Assessment and Plan:  Michele Scott is a 33 y.o. female presenting to the [facility] Department for STI screening  1. Screening for venereal disease (Primary)  - Chlamydia/Gonorrhea [place] [facility] - HIV [facility] - Syphilis Serology, [place] - WET PREP FOR TRICH, YEAST, CLUE - Gonococcus culture  2. Depression, unspecified depression type  - Ambulatory referral to [facility]   Patient accepted the following screenings: oral GC culture, vaginal CT/GC swab, vaginal wet prep, HIV, and RPR Patient meets criteria for HepB screening? No. Ordered? not applicable Patient meets criteria for HepC screening? No. Ordered? not applicable  Treat wet prep per standing order Discussed time line for State Lab results and that patient will be called with positive results and encouraged patient to call if she had not heard in 2 weeks.  Counseled to return or seek care for continued or worsening symptoms Recommended repeat testing in 3 months with positive results. Recommended condom use with all sex for STI prevention.   Patient is currently using nothing to prevent pregnancy.  Return if symptoms worsen or fail to improve.  Future Appointments  Date Time Provider Department Center  08/29/2023  9:05 AM Veda Hummingbird, FNP [facility] None    Hummingbird Veda, [place]

## 2023-08-29 NOTE — Progress Notes (Signed)
 Pt is here for STD screening. Wet prep results reviewed with pt, no treatment required per provider. Condoms declined. Patient given the opportunity to ask questions for any clarifications. Wilkie Drought, RN.

## 2023-09-04 LAB — GONOCOCCUS CULTURE

## 2023-09-13 ENCOUNTER — Encounter: Payer: Self-pay | Admitting: Family Medicine

## 2023-10-09 ENCOUNTER — Ambulatory Visit: Payer: Self-pay

## 2023-10-09 DIAGNOSIS — Z202 Contact with and (suspected) exposure to infections with a predominantly sexual mode of transmission: Secondary | ICD-10-CM

## 2023-10-09 DIAGNOSIS — Z113 Encounter for screening for infections with a predominantly sexual mode of transmission: Secondary | ICD-10-CM

## 2023-10-09 DIAGNOSIS — K029 Dental caries, unspecified: Secondary | ICD-10-CM

## 2023-10-09 DIAGNOSIS — Z206 Contact with and (suspected) exposure to human immunodeficiency virus [HIV]: Secondary | ICD-10-CM

## 2023-10-09 LAB — HM HIV SCREENING LAB: HM HIV Screening: NEGATIVE

## 2023-10-09 MED ORDER — DOXYCYCLINE HYCLATE 100 MG PO TABS: 100.0000 mg | ORAL_TABLET | Freq: Two times a day (BID) | ORAL | Status: AC

## 2023-10-09 NOTE — Progress Notes (Signed)
 Pt here for STI screening and as contact to syphilis.  No in house labs performed.  Doxycycline  100mg  #28 dispensed to patient.  Counseled re medication, side effects, plan of care and when to contact clinic with questions or concerns.  Verbalizes understanding.  PCP, PreP, and dental lists provided to patient.-Payslie Mccaig, RN

## 2023-10-09 NOTE — Progress Notes (Signed)
 [facility] Department STI clinic [address] [place] [address] Main phone: [phone]  STI screening visit  Subjective:  Michele Scott is a 33 y.o. female being seen today for a visit following notification of HIV and syphilis exposure. The patient reports they do not have symptoms.  Patient reports that they do not desire a pregnancy in the next year.   They reported they are not interested in discussing contraception today.    Patient's last menstrual period was 09/13/2023 (approximate).  Patient has the following medical conditions:  Patient Active Problem List   Diagnosis Date Noted   Supervision of high risk pregnancy, antepartum 09/03/2018   Obesity affecting pregnancy, antepartum 09/03/2018   BMI 45.0-49.9, adult (HCC) 09/03/2018   Chief Complaint  Patient presents with   SEXUALLY TRANSMITTED DISEASE   HPI Patient reports she received notification that she was a contact for syphilis and HIV. Last sexual contact was 09/06/23. Previous negative HIV and syphilis testing on 08/29/23. She has been in a relationship with this man for <2 years, and he was recently exposed via sex outside of their relationship and tested positive for HIV/syphilis. She has had sex with him one time since her negative test results.  See flowsheet for further details and programmatic requirements Hyperlink available at the top of the signed note in blue.  Flow sheet content below:  Pregnancy Intention Screening Does the patient want to become pregnant in the next year?: No Does the patient's partner want to become pregnant in the next year?: No Would the patient like to discuss contraceptive options today?: No STD Symptoms Denies all: Yes Counseling Patient counseled to use condoms with all sex: Condoms declined RTC in 2-3 weeks for test results: Yes Clinic will call if test results abnormal before test result appt.: Yes Test results given to  patient Patient counseled to use condoms with all sex: Condoms declined   Screening for MPX risk: Does the patient have an unexplained rash? No Is the patient MSM? No Does the patient endorse multiple sex partners or anonymous sex partners? No Did the patient have close or sexual contact with a person diagnosed with MPX? No Has the patient traveled outside the [place]  where MPX is endemic? No Is there a high clinical suspicion for MPX-- evidenced by one of the following No  -Unlikely to be chickenpox  -Lymphadenopathy  -Rash that present in same phase of evolution on any given body part  Screenings: Last HIV test per patient/review of record was  Lab Results  Component Value Date   HMHIVSCREEN Negative - Validated 08/29/2023    Lab Results  Component Value Date   HIV Non Reactive 07/11/2018     Last HEPC test per patient/review of record was No results found for: HMHEPCSCREEN No components found for: HEPC   Last HEPB test per patient/review of record was No components found for: HMHEPBSCREEN   Patient reports last pap was:   No results found for: SPECADGYN Result Date Procedure Results Follow-ups  07/11/2018 Cytology - PAP Adequacy: Satisfactory for evaluation  endocervical/transformation zone component PRESENT. (A) Diagnosis: ATYPICAL SQUAMOUS CELLS OF UNDETERMINED SIGNIFICANCE (ASC-US ). (A) Chlamydia: POSITIVE (A) Neisseria Gonorrhea: Negative HPV: NOT DETECTED Material Submitted: CervicoVaginal Pap [ThinPrep Imaged] (A) CYTOLOGY - PAP: PAP RESULT    Immunization history:   There is no immunization history on file for this patient.  The following portions of the patient's history were reviewed and updated as appropriate: allergies, current medications, past medical history,  past social history, past surgical history and problem list.  Objective:  There were no vitals filed for this visit.  Physical Exam Constitutional:      Appearance: Normal appearance. She is  obese.  HENT:     Head: Normocephalic.     Mouth/Throat:     Mouth: Mucous membranes are moist.     Dentition: Abnormal dentition. Dental caries present.     Pharynx: Oropharynx is clear. No pharyngeal swelling, oropharyngeal exudate or posterior oropharyngeal erythema.      Comments: Green circle represents a tooth that is filled with a green substance. Patient reports non tender. Also had multiple evident dental caries.  Eyes:     General: No scleral icterus.       Right eye: No discharge.        Left eye: No discharge.  Pulmonary:     Effort: Pulmonary effort is normal.  Lymphadenopathy:     Cervical: No cervical adenopathy.  Skin:    General: Skin is warm and dry.  Neurological:     Mental Status: She is alert.  Psychiatric:        Mood and Affect: Mood normal.        Behavior: Behavior normal.    Assessment and Plan:  Michele Scott is a 33 y.o. female presenting to the [facility] Department for STI screening  1. Screening for venereal disease (Primary)  - Syphilis Serology, [facility] - HIV/HCV [facility] [facility] - HBV Antigen/Antibody [facility] - She declined repeat gonorrhea/chlamydia/trichomonas testing today, was negative on 08/29/23  2. Syphilis contact  - Not pregnant, no sex since LMP - doxycycline  (VIBRA -TABS) 100 MG tablet; Take 1 tablet (100 mg total) by mouth 2 (two) times daily for 14 days. - Repeat testing in 3 months  3. Contact with and (suspected) exposure to human immunodeficiency virus (hiv)  - Last sexual contact was 09/06/23, outside 72 hour window for post exposure prophylaxis - Negative HIV testing on 08/29/23 - Provided patient with local pREP providers. She is uncertain about future of her relationship. Had discussion about keeping herself safe with an HIV positive partner, including importance of his compliance with treatment with goal of undetectable viral load. Stressed importance of consistent condom use to prevent HIV transmission.   - Repeat HIV testing in 3 months  4. Dental caries  - Patient reports it has been a long time since she saw a dental provider - One tooth on lower right side is filled with a green, gelatinous substance. Patient states it is non tender - Provided list of dental providers and sent referral to [facility]  Patient accepted the following screenings: HIV, RPR, Hep B, and Hep C Patient meets criteria for HepB screening? Yes. Ordered? yes Patient meets criteria for HepC screening? Yes. Ordered? yes  Treat wet prep per standing order Discussed time line for State Lab results and that patient will be called with positive results and encouraged patient to call if she had not heard in 2 weeks.  Counseled to return or seek care for continued or worsening symptoms Recommended repeat testing in 3 months with positive results. Recommended condom use with all sex for STI prevention.  PCP list provided. Referred to [facility] LCSW for counseling through this upsetting and difficult time.  Return in about 3 months (around 01/09/2024).  No future appointments.  Damien FORBES Satchel, NP

## 2023-10-10 LAB — SYPHILIS SEROLOGY, ~~LOC~~ LAB: RPR: NONREACTIVE

## 2023-10-10 LAB — HIV/HCV ~~LOC~~ LAB
HIV Ag/Ab Combo: NONREACTIVE
Hepatitis C Ab: REACTIVE
Hepatitis C Quantitation: NONREACTIVE
Hepatitis C Quantitation: NONREACTIVE
Hepatitis C RNA-PCR: NONREACTIVE

## 2023-10-11 LAB — HBV ANTIGEN/ANTIBODY STATE LAB
Hep B Core Total Ab: NONREACTIVE
Hep B S Ab: NONREACTIVE
Hepatitis B Surface Antigen: NONREACTIVE

## 2023-10-16 ENCOUNTER — Telehealth: Payer: Self-pay

## 2023-10-16 DIAGNOSIS — Z205 Contact with and (suspected) exposure to viral hepatitis: Secondary | ICD-10-CM | POA: Insufficient documentation

## 2023-10-16 NOTE — Telephone Encounter (Signed)
 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 Per pt she was aware of the positive anitbody she has been told that in the past. Stated she never had treatment for Hep C. We did discuss that re-exposure to HCV she could get repeated infection . Verbalized understanding. Did inform pt her HIV/RPR/HBV were all negative .

## 2024-01-03 ENCOUNTER — Emergency Department
Admission: EM | Admit: 2024-01-03 | Discharge: 2024-01-03 | Disposition: A | Discharge status: Home / Self Care | Payer: Self-pay | Attending: Emergency Medicine | Admitting: Emergency Medicine

## 2024-01-03 ENCOUNTER — Other Ambulatory Visit: Payer: Self-pay

## 2024-01-03 DIAGNOSIS — J020 Streptococcal pharyngitis: Secondary | ICD-10-CM | POA: Insufficient documentation

## 2024-01-03 LAB — RESP PANEL BY RT-PCR (RSV, FLU A&B, COVID)  RVPGX2
Influenza A by PCR: NEGATIVE
Influenza B by PCR: NEGATIVE
Resp Syncytial Virus by PCR: NEGATIVE
SARS Coronavirus 2 by RT PCR: NEGATIVE

## 2024-01-03 LAB — GROUP A STREP BY PCR: Group A Strep by PCR: DETECTED — AB

## 2024-01-03 MED ORDER — AMOXICILLIN-POT CLAVULANATE 875-125 MG PO TABS: 1.0000 | ORAL_TABLET | Freq: Two times a day (BID) | ORAL | 0 refills | Status: AC

## 2024-01-03 MED ORDER — ACETAMINOPHEN 325 MG PO TABS
650.0000 mg | ORAL_TABLET | Freq: Once | ORAL | Status: DC
  Filled 2024-01-03: qty 2

## 2024-01-03 MED ORDER — IBUPROFEN 600 MG PO TABS
600.0000 mg | ORAL_TABLET | Freq: Once | ORAL | Status: AC
  Administered 2024-01-03: 600 mg via ORAL
  Filled 2024-01-03: qty 1

## 2024-01-03 NOTE — ED Notes (Signed)
 Patient declined discharge vital signs.

## 2024-01-03 NOTE — ED Notes (Signed)
 Patient states she prefers Ibuprofen over Tylenol. PA aware.

## 2024-01-03 NOTE — ED Triage Notes (Signed)
 Pt comes with sore throat for two days via EMS from home. Pt states she took some otc meds with no relief.   98% RA HR 103 BP-160/105  99.2

## 2024-01-03 NOTE — Discharge Instructions (Addendum)
 Your strep test is positive.  Please take the antibiotics as prescribed.  Please return for any new, worsening, or changing symptoms or other concerns.  It was a pleasure caring for you today.

## 2024-01-03 NOTE — ED Provider Notes (Signed)
 [facility] Provider Note    Event Date/Time   First MD Initiated Contact with Patient 01/03/24 1122     (approximate)   History   Sore Throat   HPI  Michele Scott is a 33 y.o. female who presents today for evaluation of sore throat for the past 2 days.  Patient reports that she has pain when she swallows but no difficulty swallowing.  She reports that she feels like she has had a fever but she has not taken her temperature.  She has not taken anything for her symptoms.  No voice change.  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 10/16/2023   Supervision of high risk pregnancy, antepartum 09/03/2018   Obesity affecting pregnancy, antepartum 09/03/2018   BMI 45.0-49.9, adult (HCC) 09/03/2018          Physical Exam   Triage Vital Signs: ED Triage Vitals  Encounter Vitals Group     BP 01/03/24 1034 (!) 154/88     Girls Systolic BP Percentile --      Girls Diastolic BP Percentile --      Boys Systolic BP Percentile --      Boys Diastolic BP Percentile --      Pulse Rate 01/03/24 1034 (!) 101     Resp 01/03/24 1034 16     Temp 01/03/24 1034 (!) 100.4 F (38 C)     Temp Source 01/03/24 1034 Oral     SpO2 01/03/24 1034 98 %     Weight 01/03/24 1032 (!) 350 lb (158.8 kg)     Height 01/03/24 1032 5' 8 (1.727 m)     Head Circumference --      Peak Flow --      Pain Score 01/03/24 1029 4     Pain Loc --      Pain Education --      Exclude from Growth Chart --     Most recent vital signs: Vitals:   01/03/24 1034  BP: (!) 154/88  Pulse: (!) 101  Resp: 16  Temp: (!) 100.4 F (38 C)  SpO2: 98%    Physical Exam Vitals and nursing note reviewed.  Constitutional:      General: Awake and alert. No acute distress.    Appearance: Normal appearance.  HENT:     Head: Normocephalic and atraumatic.     Mouth: Mucous membranes are moist. Uvula midline.  No tonsillar  exudate.  Posterior oropharyngeal erythema present.  No soft palate fluctuance.  No trismus.  No voice change.  No sublingual swelling.  No tender cervical lymphadenopathy.  No nuchal rigidity.  No drooling. Eyes:     General: PERRL. Normal EOMs        Right eye: No discharge.        Left eye: No discharge.     Conjunctiva/sclera: Conjunctivae normal.  Cardiovascular:     Rate and Rhythm: Normal rate and regular rhythm.     Pulses: Normal pulses.  Pulmonary:     Effort: Pulmonary effort is normal. No respiratory distress.     Breath sounds: Normal breath sounds.  Abdominal:     Abdomen is soft. There is no abdominal tenderness. No rebound or guarding. No distention. Musculoskeletal:        General: No swelling. Normal range of motion.     Cervical back: Normal range of motion and neck supple.  Skin:    General:  Skin is warm and dry.     Capillary Refill: Capillary refill takes less than 2 seconds.     Findings: No rash.  Neurological:     Mental Status: The patient is awake and alert.      ED Results / Procedures / Treatments   Labs (all labs ordered are listed, but only abnormal results are displayed) Labs Reviewed  GROUP A STREP BY PCR - Abnormal; Notable for the following components:      Result Value   Group A Strep by PCR DETECTED (*)    All other components within normal limits  RESP PANEL BY RT-PCR (RSV, FLU A&B, COVID)  [id number]     EKG     RADIOLOGY     PROCEDURES:  Critical Care performed:   Procedures   MEDICATIONS ORDERED IN ED: Medications  ibuprofen (ADVIL) tablet 600 mg (600 mg Oral Given 01/03/24 1159)     IMPRESSION / MDM / ASSESSMENT AND PLAN / ED COURSE  I reviewed the triage vital signs and the nursing notes.   Differential diagnosis includes, but is not limited to, strep pharyngitis, viral pharyngitis, other URI, COVID-19.  Patient is awake and alert, febrile to 100.4 F and tachycardic to 101 though normotensive.  Normal oxygen  saturation 98% on room air.  She demonstrates no increased work of breathing.  Uvula is midline, no tonsillar exudate, patient is, no drooling, no voice change, I do not suspect peritonsillar or retropharyngeal abscess.  COVID/flu/RSV and strep swabs obtained.  Strep swab is positive.  She was originally ordered Tylenol for her fever, though she reports that she prefers ibuprofen.  She denies any chance of pregnancy and does not wish to take a pregnancy test prior to this medication, and understands that this medication is dangerous if she is indeed pregnant.  She was started on antibiotics and I emphasized the importance of taking the entire course even if she begins to feel better.  Also advised that she is contagious to others.  We discussed return precautions in the meantime.  Patient understands and agrees with plan.  Discharged in stable condition.   Patient's presentation is most consistent with acute complicated illness / injury requiring diagnostic workup.      FINAL CLINICAL IMPRESSION(S) / ED DIAGNOSES   Final diagnoses:  Strep throat     Rx / DC Orders   ED Discharge Orders          Ordered    amoxicillin-clavulanate (AUGMENTIN) 875-125 MG tablet  2 times daily        01/03/24 1122             Note:  This document was prepared using Dragon voice recognition software and may include unintentional dictation errors.   Rajesh Wyss E, PA-C 01/03/24 1219    Viviann Pastor, MD 01/03/24 1253
